# Patient Record
Sex: Male | Born: 1950 | Race: White | Hispanic: No | Marital: Married | Smoking: Former smoker
Health system: Southern US, Community
[De-identification: ages and names within clinical notes are randomized; demographics above are authoritative.]

## PROBLEM LIST (undated history)

## (undated) DIAGNOSIS — E785 Hyperlipidemia, unspecified: Secondary | ICD-10-CM

## (undated) DIAGNOSIS — K589 Irritable bowel syndrome without diarrhea: Secondary | ICD-10-CM

## (undated) DIAGNOSIS — K279 Peptic ulcer, site unspecified, unspecified as acute or chronic, without hemorrhage or perforation: Secondary | ICD-10-CM

## (undated) DIAGNOSIS — F17201 Nicotine dependence, unspecified, in remission: Secondary | ICD-10-CM

## (undated) DIAGNOSIS — I2510 Atherosclerotic heart disease of native coronary artery without angina pectoris: Secondary | ICD-10-CM

## (undated) DIAGNOSIS — I219 Acute myocardial infarction, unspecified: Secondary | ICD-10-CM

## (undated) DIAGNOSIS — I10 Essential (primary) hypertension: Secondary | ICD-10-CM

## (undated) DIAGNOSIS — Z87442 Personal history of urinary calculi: Secondary | ICD-10-CM

## (undated) DIAGNOSIS — M1990 Unspecified osteoarthritis, unspecified site: Secondary | ICD-10-CM

## (undated) HISTORY — PX: CARDIAC CATHETERIZATION: SHX172

## (undated) HISTORY — DX: Atherosclerotic heart disease of native coronary artery without angina pectoris: I25.10

## (undated) HISTORY — DX: Nicotine dependence, unspecified, in remission: F17.201

## (undated) HISTORY — DX: Unspecified osteoarthritis, unspecified site: M19.90

## (undated) HISTORY — DX: Irritable bowel syndrome, unspecified: K58.9

## (undated) HISTORY — DX: Peptic ulcer, site unspecified, unspecified as acute or chronic, without hemorrhage or perforation: K27.9

## (undated) HISTORY — DX: Hyperlipidemia, unspecified: E78.5

---

## 1988-01-12 HISTORY — PX: KIDNEY STONE SURGERY: SHX686

## 2003-01-06 ENCOUNTER — Emergency Department (HOSPITAL_COMMUNITY): Admission: EM | Admit: 2003-01-06 | Discharge: 2003-01-06 | Payer: Self-pay | Admitting: Emergency Medicine

## 2003-01-12 DIAGNOSIS — I2510 Atherosclerotic heart disease of native coronary artery without angina pectoris: Secondary | ICD-10-CM

## 2003-01-12 HISTORY — DX: Atherosclerotic heart disease of native coronary artery without angina pectoris: I25.10

## 2003-01-14 ENCOUNTER — Inpatient Hospital Stay (HOSPITAL_COMMUNITY): Admission: AD | Admit: 2003-01-14 | Discharge: 2003-01-17 | Payer: Self-pay | Admitting: Cardiology

## 2003-01-14 ENCOUNTER — Encounter: Payer: Self-pay | Admitting: Emergency Medicine

## 2003-01-31 ENCOUNTER — Encounter (HOSPITAL_COMMUNITY): Admission: RE | Admit: 2003-01-31 | Discharge: 2003-03-02 | Payer: Self-pay | Admitting: *Deleted

## 2003-03-04 ENCOUNTER — Encounter (HOSPITAL_COMMUNITY): Admission: RE | Admit: 2003-03-04 | Discharge: 2003-04-03 | Payer: Self-pay | Admitting: *Deleted

## 2003-06-12 ENCOUNTER — Ambulatory Visit (HOSPITAL_COMMUNITY): Admission: RE | Admit: 2003-06-12 | Discharge: 2003-06-12 | Payer: Self-pay | Admitting: *Deleted

## 2004-03-15 ENCOUNTER — Emergency Department (HOSPITAL_COMMUNITY): Admission: EM | Admit: 2004-03-15 | Discharge: 2004-03-15 | Payer: Self-pay | Admitting: Emergency Medicine

## 2004-03-17 ENCOUNTER — Encounter: Payer: Self-pay | Admitting: *Deleted

## 2004-03-17 ENCOUNTER — Inpatient Hospital Stay (HOSPITAL_COMMUNITY): Admission: EM | Admit: 2004-03-17 | Discharge: 2004-03-20 | Payer: Self-pay | Admitting: *Deleted

## 2004-04-09 ENCOUNTER — Ambulatory Visit (HOSPITAL_COMMUNITY): Admission: RE | Admit: 2004-04-09 | Discharge: 2004-04-09 | Payer: Self-pay | Admitting: Internal Medicine

## 2005-02-02 ENCOUNTER — Ambulatory Visit: Payer: Self-pay | Admitting: *Deleted

## 2006-01-11 HISTORY — PX: COLONOSCOPY: SHX174

## 2007-02-28 ENCOUNTER — Ambulatory Visit: Payer: Self-pay | Admitting: Cardiology

## 2008-03-26 ENCOUNTER — Ambulatory Visit: Payer: Self-pay | Admitting: Cardiology

## 2009-03-11 ENCOUNTER — Encounter (INDEPENDENT_AMBULATORY_CARE_PROVIDER_SITE_OTHER): Payer: Self-pay | Admitting: *Deleted

## 2009-05-06 DIAGNOSIS — M549 Dorsalgia, unspecified: Secondary | ICD-10-CM | POA: Insufficient documentation

## 2009-05-06 DIAGNOSIS — K589 Irritable bowel syndrome without diarrhea: Secondary | ICD-10-CM

## 2009-05-06 DIAGNOSIS — M542 Cervicalgia: Secondary | ICD-10-CM | POA: Insufficient documentation

## 2009-05-06 DIAGNOSIS — K279 Peptic ulcer, site unspecified, unspecified as acute or chronic, without hemorrhage or perforation: Secondary | ICD-10-CM

## 2009-05-12 ENCOUNTER — Ambulatory Visit: Payer: Self-pay | Admitting: Cardiology

## 2009-05-13 ENCOUNTER — Encounter: Payer: Self-pay | Admitting: Adult Health

## 2010-02-10 NOTE — Miscellaneous (Signed)
Summary: cbc,cmp,lipids  Clinical Lists Changes  Appended Document: cbc,cmp,lipids    Clinical Lists Changes  Observations: Added new observation of BACTERIA URN: rare (03/11/2009 13:49) Added new observation of RBCS MICRO U: 0-2 (03/11/2009 13:49) Added new observation of WBCS MICRO U: 0-2 (03/11/2009 13:49) Added new observation of CASTS URINE: none seen (03/11/2009 13:49) Added new observation of WBC UR: 0-2 (03/11/2009 13:49) Added new observation of NITRITE URN: neg (03/11/2009 13:49) Added new observation of PROTEIN UR: neg (03/11/2009 13:49) Added new observation of BLOOD UR: neg (03/11/2009 13:49) Added new observation of GLUCOSE UA: neg (03/11/2009 13:49) Added new observation of PH URINE: 5.5  (03/11/2009 13:49) Added new observation of SPEC GR URIN: 1.025  (03/11/2009 13:49) Added new observation of CALCIUM: 8.8 mg/dL (16/10/9602 54:09) Added new observation of ALBUMIN: 4.4 g/dL (81/19/1478 29:56) Added new observation of PROTEIN, TOT: 7.1 g/dL (21/30/8657 84:69) Added new observation of SGPT (ALT): 16 units/L (03/11/2009 13:49) Added new observation of SGOT (AST): 15 units/L (03/11/2009 13:49) Added new observation of ALK PHOS: 57 units/L (03/11/2009 13:49) Added new observation of BILI DIRECT: total bili   0.2 mg/dL (62/95/2841 32:44) Added new observation of CREATININE: 1.24 mg/dL (01/13/7251 66:44) Added new observation of BUN: 17 mg/dL (03/47/4259 56:38) Added new observation of BG RANDOM: 116 mg/dL (75/64/3329 51:88) Added new observation of CO2 PLSM/SER: 22 meq/L (03/11/2009 13:49) Added new observation of CL SERUM: 106 meq/L (03/11/2009 13:49) Added new observation of K SERUM: 4.7 meq/L (03/11/2009 13:49) Added new observation of NA: 141 meq/L (03/11/2009 13:49) Added new observation of LDL: 119 mg/dL (41/66/0630 16:01) Added new observation of HDL: 39 mg/dL (09/32/3557 32:20) Added new observation of TRIGLYC TOT: 198 mg/dL (25/42/7062 37:62) Added new  observation of CHOLESTEROL: 198 mg/dL (83/15/1761 60:73) Added new observation of PLATELETK/UL: 293 K/uL (03/11/2009 13:49) Added new observation of MCV: 89.1 fL (03/11/2009 13:49) Added new observation of HCT: 43.3 % (03/11/2009 13:49) Added new observation of HGB: 13.8 g/dL (71/06/2692 85:46) Added new observation of WBC COUNT: 7.1 10*3/microliter (03/11/2009 13:49)

## 2010-02-10 NOTE — Assessment & Plan Note (Signed)
Summary: 1 YR F/U PER REMINDER LIST/TG  Medications Added METOPROLOL SUCCINATE 50 MG XR24H-TAB (METOPROLOL SUCCINATE) Take 1/2 tablet by mouth once a day METOPROLOL SUCCINATE 50 MG XR24H-TAB (METOPROLOL SUCCINATE) Take 1/2 tablet by mouth once a day GEMFIBROZIL 600 MG TABS (GEMFIBROZIL) Take 1 tablet by mouth two times a day BAYER ASPIRIN 325 MG TABS (ASPIRIN) Take 1 tablet by mouth once a day      Allergies Added: NKDA  Visit Type:  Follow-up Primary Provider:  Sheppard Penton  CC:  follow-up visit.  History of Present Illness: Mr. Jeffren is a very pleasant  60 y/o CM who were are seeing on annual follow-up for known history of CAD.  He has a history of STEMI in Jan. 2005 with DES to the proximal LAD, secondary to 95% proximal obstruction, 70% ostial obstruction of the first and second diagonals.  No major obstruction in the CX or RCA.  EF at that time was 45%-50%.  Follow-up Echo was completed in June of the same year with EF calculated at 55%.  There was hypokinesis of a small segment of the mid and distal septum.  Since last visit in March of 2010, Mr. Trung has done well. He is complaining of extra stress in his life right now. He works for the [employer].  With the recent economic status and unemployment levels, he has been working longer hours and listening to a lot of people who are out of work.  He said that it really gets to him sometimes.  He therefore has a lot of shoulder and neck discomfort.  He thought that it may be the gemfibrozil causing this.  He cut back from 600mg  two times a day to daily with no change in symptoms.  He denies SOB, DOE, chest discomfort or fatigue. His main complaint is stress and muscle aches. He has stopped exercising regularly because of winter weather and has gotten out of the habit.  Preventive Screening-Counseling & Management  Alcohol-Tobacco     Smoking Status: quit     Year Quit: 2005  Current Medications (verified): 1)   Metoprolol Succinate 50 Mg Xr24h-Tab (Metoprolol Succinate) .... Take 1/2 Tablet By Mouth Once A Day 2)  Gemfibrozil 600 Mg Tabs (Gemfibrozil) .... Take 1 Tablet By Mouth Two Times A Day 3)  Bayer Aspirin 325 Mg Tabs (Aspirin) .... Take 1 Tablet By Mouth Once A Day  Allergies (verified): No Known Drug Allergies  Comments:  Nurse/Medical Assistant: The patient's medications and allergies were reviewed with the patient and were updated in the Medication and Allergy Lists. List reviewed.  Past History:  Past medical, surgical, family and social histories (including risk factors) reviewed, and no changes noted (except as noted below).  Past Medical History: Reviewed history from 05/06/2009 and no changes required. Current Problems:  BACK PAIN, CHRONIC (ICD-724.5) NECK PAIN, CHRONIC (ICD-723.1) CAD (ICD-414.00) IRRITABLE BOWEL SYNDROME (ICD-564.1) PUD, HX OF (ICD-V12.71) TOBACCO ABUSE, HX OF (ICD-V15.82) DYSLIPIDEMIA (ICD-272.4) AMI (ICD-410.90)  Past Surgical History: Reviewed history from 05/06/2009 and no changes required. cath 01/14/2003 90% Prox LAD c Cypherstent 10% Ef 45-50% kidney stone 1990  Family History: Reviewed history from 05/06/2009 and no changes required. Father:deceased age 76 due to kidney failure Mother:alive 28 tachycardia Siblings:brother alive and well age 42 1 sister alive and well age 17  Social History: Reviewed history from 05/06/2009 and no changes required. Full Time Married  Tobacco Use - Former.  Alcohol Use - no Regular Exercise - yes Drug Use -  no  Review of Systems       Generalized aches and pains of the neck and shoulder.  All other systems have been reviewed and are negative unless stated above.   Vital Signs:  Patient profile:   60 year old male Height:      72 inches Weight:      235 pounds BMI:     31.99 Pulse rate:   76 / minute BP sitting:   118 / 82  (left arm) Cuff size:   large  Vitals Entered By: Carlye Grippe  (May 12, 2009 3:36 PM)  Nutrition Counseling: Patient's BMI is greater than 25 and therefore counseled on weight management options. CC: follow-up visit   Physical Exam  General:  Well developed, well nourished, in no acute distress. Head:  normocephalic and atraumatic Eyes:  PERRLA/EOM intact; conjunctiva and lids normal. Ears:  TM's intact and clear with normal canals and hearing Nose:  no deformity, discharge, inflammation, or lesions Mouth:  Teeth, gums and palate normal. Oral mucosa normal. Neck:  Neck supple, no JVD. No masses, thyromegaly or abnormal cervical nodes. Lungs:  Clear bilaterally to auscultation and percussion. Heart:  Non-displaced PMI, chest non-tender; regular rate and rhythm, S1, S2 without murmurs, rubs or gallops. Carotid upstroke normal, no bruit. Normal abdominal aortic size, no bruits. Femorals normal pulses, no bruits. Pedals normal pulses. No edema, no varicosities. Abdomen:  Bowel sounds positive; abdomen soft and non-tender without masses, organomegaly, or hernias noted. No hepatosplenomegaly. Msk:  Back normal, normal gait. Muscle strength and tone normal. Extremities:  No clubbing or cyanosis. Neurologic:  Alert and oriented x 3. Skin:  Intact without lesions or rashes. Psych:  depressed affect.     EKG  Procedure date:  05/12/2009  Findings:      Normal sinus rhythm with rate of:  76 bpm  Impression & Recommendations:  Problem # 1:  CAD (ICD-414.00) Assessment Unchanged No symptoms. Seems to be doing well from a cardiac standpoint. Will see again in 1 year. No cardiac testing at this time as he is asymptomatic. i have advised him to resume his exercise regimine. His updated medication list for this problem includes:    Metoprolol Succinate 50 Mg Xr24h-tab (Metoprolol succinate) .Marland Kitchen... Take 1/2 tablet by mouth once a day    Bayer Aspirin 325 Mg Tabs (Aspirin) .Marland Kitchen... Take 1 tablet by mouth once a day  Orders: EKG w/ Interpretation  (93000)  Problem # 2:  NECK PAIN, CHRONIC (ICD-723.1) This appears to be stress related.  I have advised him to stop the gemfibrozil for one week.  If pain persists, he will follow-up with primary care physician for further evaluation.  I doubt the pain is related to gemfibrozil and stopping it for one week should not be harmful.   Patient Instructions: 1)  Your physician recommends that you schedule a follow-up appointment in: 1 year 2)  Your physician has recommended you make the following change in your medication: Stop taking Gemfibrozil for 1 week, if pain stops remain off medication if pain still present resume Gemfibrozil and follow up with Dr. Ouida Sills Prescriptions: METOPROLOL SUCCINATE 50 MG XR24H-TAB (METOPROLOL SUCCINATE) Take 1/2 tablet by mouth once a day  #15 x 12   Entered by:   Larita Fife Via LPN   Authorized by:   Joni Reining, NP   Signed by:   Larita Fife Via LPN on 16/10/9602   Method used:   Electronically to        [facility]* (retail)       [address]       [place], [place]  [address]       Ph: [phone] or [phone]       Fax: [phone]   RxID:   [id number]

## 2010-05-26 NOTE — Letter (Signed)
February 28, 2007    Kingsley Callander. Ouida Sills, MD  [address]  [place], [place] [address]   RE:  Johnny Johns, Johnny Johns  MRN:  [id number]  /  DOB:  1950-03-27   Dear Channing Mutters:   Johnny Johns is seen in the office today after being lost to follow up for  the past 2 years.  He call for a prescription renewal and was advised  that it was time to be seen.  As you know, he has done very well since  suffering an acute anterior myocardial infarction in January of 2005,  treated with stenting of a 90% proximal LAD lesion.  LV systolic  function was mildly impaired at that time.  He was treated with Plavix  for 6 months thereafter, and has been taking only a low dose of atenolol  and aspirin since then.  He has been tried on multiple statins, all with  muscle symptoms.  He also reports an adverse reaction to a different  cholesterol medicine, but does not know which one.   Otherwise, he has done very well.  His only problem was with GERD  symptoms that are well controlled with Nexium as needed.  He has not  been to the emergency department nor required hospitalization.  Vaccinations are up-to-date.   PHYSICAL EXAMINATION:  GENERAL:  A pleasant gentleman in no acute  distress.  VITAL SIGNS:  The weight is 220, 7 pounds less than in January of 2007.  Blood pressure 115/80, heart rate 65 and regular, respirations 18.  NECK:  No jugular venous distention; normal carotid upstrokes without  bruits.  ENDOCRINE:  No thyromegaly.  LUNGS:  Clear.  CARDIAC:  Normal first and second heart sounds.  Normal PMI.  ABDOMEN:  Soft and nontender; no bruits; no organomegaly.  EXTREMITIES:  Distal pulses intact; no edema.   LABORATORY DATA:  Laboratory from your office shows a normal CBC and  normal chemistry profile.  Lipid profile is suboptimal with a total  cholesterol of 188, triglycerides of 289, HDL of 31 and LDL of 99.   IMPRESSION:  Johnny Johns is doing quite well overall.  Since he cannot  tolerate a statin, a fibrate  would be the next appropriate choice.  We  will start fenofibrate at a dose of 145 mg daily and check a lipid  profile in 1 month.  If results are acceptable, I will plan to see this  nice gentleman again in 1 year.    Sincerely,      Gerrit Friends. Dietrich Pates, MD, [facility]  Electronically Signed    RMR/MedQ  DD: 02/28/2007  DT: 03/01/2007  Job #: [id number]

## 2010-05-26 NOTE — Assessment & Plan Note (Signed)
[facility]                       [place] CARDIOLOGY OFFICE NOTE   NAME:Johnny Johns, Johnny Johns                       MRN:          [id number]  DATE:03/26/2008                            DOB:          26-Dec-1950    CARDIOLOGIST:  Gerrit Friends. Dietrich Pates, MD, [facility]   PRIMARY CARE PHYSICIAN:  Kingsley Callander. Ouida Sills, MD   REASON FOR VISIT:  One-year followup.   HISTORY OF PRESENT ILLNESS:  Johnny Johns is a 60 year old male with a  history of coronary artery disease status post acute anterior STEMI in  January 2005 treated with Cypher drug-eluting stent to the proximal LAD  with an EF of 45-50%.  I do not see that he has had an assessment of his  LV function since the time of his heart attack.  He did have residual  70% ostial stenosis in the first and second diagonal branches of the  LAD.  He had no significant obstruction in the circumflex or RCA.  He  was last seen by Dr. Dietrich Pates in February 2009.  At that time, he was  doing well.  The patient had had significant problems with tolerating  all statins.  He was placed on gemfibrozil and fish oil at that time.  He tells me he recently had lipids performed by Dr. Ouida Sills.  He denies  any recent symptoms of chest discomfort or significant shortness of  breath.  Denies any orthopnea, PND, or pedal edema.  Denies any  palpitations or syncope.   CURRENT MEDICATIONS:  1. Aspirin 325 mg daily.  2. Gemfibrozil 600 mg b.i.d.  3. Fish oil 2 tablets daily.  4. Xanax p.r.n.  5. Nitroglycerin p.r.n.   PHYSICAL EXAMINATION:  GENERAL:  He is a well-nourished, well-developed  male in no acute distress.  VITAL SIGNS:  Blood pressure is 120/82, pulse 96, weight 232 pounds.  HEENT:  Normal.  NECK:  Without JVD.  CARDIAC:  Normal S1 and S2.  Regular rate and rhythm.  LUNGS:  Clear to auscultation bilaterally.  ABDOMEN:  Soft, nontender.  EXTREMITIES:  Without edema.  NEUROLOGIC:  He is alert and oriented x3.  Cranial nerves II-XII  are  grossly intact.  VASCULAR:  Without carotid bruits bilaterally.   Electrocardiogram reveals sinus rhythm with a heart rate of 96.  Normal  axis.  Inferior Q-waves, poor R-wave progression.  No ischemic changes.   ASSESSMENT AND PLAN:  1. Coronary artery disease status post anterior ST-elevation      myocardial infarction January 2005, treated with Cypher stent to      the left anterior descending.  He had mild LV dysfunction with an      EF of 45-50% at that time.  He is having no symptoms of angina at      this time.  He is having no symptoms of heart failure.  He      continues on an aspirin a day and he is doing well.  He does have a      high basal heart rate in the 90s.  He had his atenolol discontinued      when  he was last seen.  I think he would benefit from being on a      beta-blocker especially in light of the fact he has had prior      myocardial infarction.  I would rather see his heart rate down in      the 70s to 60s.  Therefore, I have placed him back on metoprolol ER      25 mg daily.  I discussed this with him and he agrees to start the      medication.  2. Dyslipidemia.  He is followed chronically by Dr. Ouida Sills.  He      apparently had a recent lipid panel.  We will obtain those values      for our records.  As noted previously, he is intolerant to all      STATINS.   DISPOSITION:  The patient will follow up with me or Dr. Dietrich Pates in 1  year or sooner p.r.n.      Tereso Newcomer, PA-C  Electronically Signed      Gerrit Friends. Dietrich Pates, MD, [facility]  Electronically Signed   SW/MedQ  DD: 03/26/2008  DT: 03/27/2008  Job #: [id number]   cc:   Kingsley Callander. Ouida Sills, MD

## 2010-05-29 NOTE — Discharge Summary (Signed)
NAME:  Johnny Johns, Johnny Johns                          ACCOUNT NO.:  [id number]   MEDICAL RECORD NO.:  [id number]                   PATIENT TYPE:  INP   LOCATION:  3737                                 FACILITY:  [facility]   PHYSICIAN:  Knobel Bing, M.D.               DATE OF BIRTH:  [birth year]   DATE OF ADMISSION:  01/14/2003  DATE OF DISCHARGE:  01/17/2003                                 DISCHARGE SUMMARY   PROCEDURE:  1. Cardiac catheterization.  2. Coronary arteriogram.  3. Left ventriculogram.  4. PTCA and Cypher stent to one vessel.   HOSPITAL COURSE:  Johnny Johns is a 60 year old male with no known history of  coronary artery disease.  He had onset of substernal chest pain at 6:30 a.m.  and went to [facility] ER where his EKG showed anterior ST elevation.  He  was treated there with aspirin, heparin, and Integrilin and transported  urgently to [facility] for emergent catheterization.   The cardiac catheterization showed a 95% mid LAD that was treated with PTCA  and a Cypher stent reducing the stenosis to less than 10%.  He had distal  embolus seen on the catheterization films and 70% stenosis in each of the  first two diagonals, but no other critical disease.  His EF was 45-50% with  apical hypokinesis.  He tolerated the procedure well and the sheath was  removed without difficulty.   His enzymes were positive for an MI but his CK and MBs trended down and  normalized.  He had no further episodes of chest pain.  He had a Statin  added to his medication regimen and had a lipid profile checked as part of  his evaluation.  The lipid profile showed a total cholesterol of 165,  triglycerides 229, HDL 28, LDL 91.  He is to go home on Zocor 40 mg daily  and have follow-up LFTs and a lipid profile in six to 12 weeks.   Johnny Johns had some sutures placed on January 06, 2003 after an accident  with a carving knife.  The sutures were 10 days out and it was felt it was  time to remove  them.  The suture site was without erythema or edema and no  signs of infection.  The sutures were removed and Steri-Strips were applied.   Johnny Johns was seen by cardiac rehabilitation and was ambulating without  chest pain or shortness of breath.  He was referred for outpatient cardiac  rehabilitation.  Additionally, he had a history of tobacco use prior to  coming into the hospital, but stated he would quit smoking and seemed very  motivated.   By January 17, 2003 Dr. Juanda Chance felt that he was stable for discharge and  could follow up in the [place] office.   LABORATORIES:  CK-MB peaked 2250/253.7.  Sodium 141, potassium 4.0, chloride  110, CO2 25, BUN 11, creatinine  1.0, glucose 100.  Hemoglobin 13.0,  hematocrit 38.5, WBC 12.1, platelets 230,000.   Chest x-ray:  No acute cardiopulmonary findings.   CONDITION ON DISCHARGE:  Improved.   DISCHARGE DIAGNOSES:  1. Acute anterior wall myocardial infarction.  2. Dyslipidemia.  3. History of tobacco use.  4. History of peptic ulcer disease secondary to Aleve and lower     gastrointestinal bleed.  5. Irritable bowel syndrome.  6. History of chronic back and neck pain.  7. History of intolerance to Vioxx with joint swelling.   DISCHARGE INSTRUCTIONS:  His activity level is to include no heavy lifting  or strenuous activity until cleared by M.D. and no driving for five days.  He can most likely return to work on February 12, 2003.  He is to stick to a  low fat and salt diet and call the office for any problems with  catheterization site.  He is not to use tobacco.  he has a follow-up  appointment with Dr. Dorethea Clan in [place] on January 19 at 1:00.  He is to  follow up with Dr. Ouida Sills as scheduled.   DISCHARGE MEDICATIONS:  1. Aspirin 325 mg daily.  2. Metoprolol 50 mg one-half tablet b.i.d.  3. Plavix 75 mg daily.  4. Xanax 0.25 mg p.r.n.  5. Zocor 40 mg daily.  6. Nitroglycerin p.r.n.      Theodore Demark, P.A. [facility]                   Randlett Bing, M.D.    RB/MEDQ  D:  01/17/2003  T:  01/18/2003  Job:  [id number]   cc:   Kingsley Callander. Ouida Sills, M.D.  [address]  [place]  [place] [address]  Fax: [phone]   Vida Roller, M.D.  Fax: [phone]

## 2010-05-29 NOTE — Procedures (Signed)
NAME:  MYKAEL, BATZ                          ACCOUNT NO.:  [id number]   MEDICAL RECORD NO.:  [id number]                   PATIENT TYPE:  OUT   LOCATION:  RAD                                  FACILITY:  [facility]   PHYSICIAN:  [place] Bing, M.D.               DATE OF BIRTH:  [birth year]   DATE OF PROCEDURE:  06/12/2003  DATE OF DISCHARGE:                                  ECHOCARDIOGRAM   REFERRING PHYSICIAN:  Kingsley Callander. Ouida Sills, M.D. and Vida Roller, M.D.   CLINICAL DATA:  A 60 year old gentleman with coronary disease including  prior myocardial infarction.   M-MODE MEASUREMENTS:  Aorta 2.9, left atrium 4.6, septum 1.2, posterior wall  1.3, LV diastole 4.8, LV systole 2.8.   1. Technically adequate echocardiographic study.  2. Normal left atrium, right atrium, and right ventricle.  3. Normal aortic valve; mild annular calcification.  4. Normal mitral valve; mild annular calcification.  5. Normal tricuspid and pulmonic valves; trivial regurgitation.  6. Normal internal dimension of the left ventricle; mild concentric LVH.     Hypokinesis of a small segment of the mid and distal septum; overall LV     systolic function is normal with an estimated ejection fraction of 0.55.  7. Normal IVC.      ___________________________________________                                             Bing, M.D.   RR/MEDQ  D:  06/12/2003  T:  06/13/2003  Job:  [id number]   cc:   Kingsley Callander. Ouida Sills, M.D.  [address]  [place]  [place] [address]  Fax: [phone]   Vida Roller, M.D.  Fax: [phone]
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
## 2010-05-29 NOTE — Cardiovascular Report (Signed)
NAME:  Johnny Johns, Johnny Johns                          ACCOUNT NO.:  [id number]   MEDICAL RECORD NO.:  [id number]                   PATIENT TYPE:  INP   LOCATION:  2860                                 FACILITY:  [facility]   PHYSICIAN:  Charlies Constable, M.D.                  DATE OF BIRTH:  1950/12/04   DATE OF PROCEDURE:  01/14/2003  DATE OF DISCHARGE:                              CARDIAC CATHETERIZATION   CLINICAL HISTORY:  Johnny Johns is 60 years old and is Interior and spatial designer of [employer].  He has no prior history of heart disease, but  is a smoker.  He developed the onset of chest pain at 6:30 this morning and  presented to [facility] Emergency Room where his EKG showed an acute  anterior wall infarction.  He was given aspirin, heparin and integrilin and  transferred to [place] for intervention by [facility].   PROCEDURE:  The procedure was performed via the right femoral artery using  arterial sheath and 6-French preformed coronary catheters.  A front wall  arterial puncture was performed and Omnipaque contrast was used.  After  completion of the diagnostic study we made decision to proceed with  intervention.   The patient's ACT was greater than 200 seconds from his previous heparin  dosage.  He was given 300 mg of Plavix and was on an integrilin drip which  had been started at [facility].  We used a Q-4 7-French guiding catheter  with side holes and a PT-2 light support wire.  We crossed the subtotally  occluded lesion in the proximal LAD with the wire without difficulty.  There  appeared to be a thrombus burden at the lesion and so we elected to go in  first with the export catheter.  We made two passes and removed some clot  and some atheromatous material with improvement in the appearance of the  vessel.  There was a distal embolization which we detected later in the  procedure and this may have occurred at the time of export or at the time of  subsequent balloon inflation.   After completion of the export run, we  dilated with a 2.75 x 20-mm Quantum Maverick performing one inflation up to  12 atmospheres for 30 seconds. We then deployed a 3.0 x 23-mm Cypher stent  with one inflation of 14 atmospheres for 30 seconds.  There were two  diagonal branches just before the lesion and there was some segmental  disease extending just past the second diagonal branch.  We attempted to  place the proximal edge of the stent so it would not cross the diagonal  branch although we realized there was some segmental disease in this area.  We then post dilated the stent with a 3.25 x 20-mm Quantum Maverick avoiding  both distal edges.  We inflated this to 15 atmospheres for 30 seconds.  Repeat diagnostic studies  were then performed through the guiding catheter.  The patient tolerated the procedure well and left the laboratory in  satisfactory condition.   RESULTS:  Left main coronary artery:  The left main coronary was free of  significant disease.   Left anterior descending artery:  Left anterior descending artery gave rise  to two diagonal branches, septal perforator, two more septal perforators and  a third diagonal branch.  There was a long area of 95% stenosis after the  second diagonal branch which appeared to have superimposed thrombus.  The  initial flow was TIMI-3 flow.  There were 70% ostial stenosis at both the  first and second diagonal branches.   Circumflex artery:  The circumflex artery gave rise to an intermediate  branch, marginal branch, posterior lateral branch. These vessels were free  of significant disease.  This was a small vessel.   Right coronary artery:  The right coronary artery is a large dominant vessel  that supplied a conus branch, two right ventricular branches, a posterior  descending branch and three large posterior lateral branches.  These vessels  were free of significant disease.   LEFT VENTRICULOGRAM:  The left ventriculogram performed  in the RAO  projection showed hypokinesis of the apex.  The overall wall motion was  fairly well preserved and the estimated ejection fraction was 45-50%.   The aortic pressure was 89/63 with a mean of 75.  Left ventricular pressure  was 89/13.   Following thrombectomy and stenting of the lesion in the proximal LAD, the  stenosis improved from 95% to less than 10% and the flow remained at TIMI-3  flow.  There was a small distal embolus at the very tip of the apex with  abrupt cut off of the vessel.   CONCLUSIONS:  1. Acute anterior wall myocardial infarction with 95% stenosis in the     proximal LAD, 70% ostial stenoses in the first and second diagonal     branches of the LAD.  No major obstruction in the circumflex and right     coronary arteries and apical wall hypokinesis with an estimated ejection     fraction of 45-50%.  2. Successful stenting of the lesion in the proximal LAD with improvement of     stent renarrowing from 95% to less than 10% with TIMI-3 flow before and     after intervention.   DISPOSITION:  The patient returned to the postangioplasty unit for further  observation. I think his outlook for recovery is good since the artery was  open and there was wall motion at the time of initial study.                                               Charlies Constable, M.D.    BB/MEDQ  D:  01/14/2003  T:  01/14/2003  Job:  [id number]   cc:   Kingsley Callander. Ouida Sills, M.D.  [address]  [place]  [place] [address]  Fax: [phone]   Vida Roller, M.D.  Fax: [phone]

## 2010-05-29 NOTE — Discharge Summary (Signed)
NAME:  ESKER, DEVER                ACCOUNT NO.:  [id number]   MEDICAL RECORD NO.:  [id number]          PATIENT TYPE:  INP   LOCATION:  A314                          FACILITY:  [facility]   PHYSICIAN:  Kingsley Callander. Ouida Sills, MD       DATE OF BIRTH:  1950/02/04   DATE OF ADMISSION:  03/17/2004  DATE OF DISCHARGE:  03/10/2006LH                                 DISCHARGE SUMMARY   DISCHARGE DIAGNOSES:  1.  Right lower lobe pneumonia.  2.  Coronary artery disease.  3.  History of peptic ulcer disease.  4.  Irritable bowel syndrome.  5.  Hyperlipidemia.  6.  History of nephrolithiasis.   HOSPITAL COURSE:  This patient is a 60 year old male who presented with  cough and sore throat. Temperature had been in the 101 range. He had been  emergency room 2 days prior to admission and had been treated with Zithromax  for pneumonia. On presentation, his pO2 was 41. Due to our CT scanner being  down, he was sent to [facility] for a CT scan of the chest. His CT revealed  bilateral lower lobe pneumonia but no pulmonary embolus. His oxygen  saturation was 100% on 2 liters. He was hospitalized and treated with  Rocephin and Zithromax IV. Due to monocytosis on a CBC, a monospot was  obtained which was negative. A sputum culture still remains pending. He had  a sore throat which was red on exam. He had no exudate. Strep screen was  negative. His white count was initially 9,000. A repeat was 5,400, each with  15% monocytes.   His fever resolved. His cough improved. His pharyngitis improved. He was  improved and stable for discharge on the 10th. He will be seen in follow-up  in the office in 1-2 weeks and will have a repeat chest x-ray prior to that  visit. A repeat ABG on room air revealed pO2 of 63.   DISCHARGE MEDICATIONS:  1.  Avelox 400 mg q.d. for 7 days.  2.  Atenolol 12.5 mg q.d.  3.  Aspirin 325 mg q.d.  4.  Vytorin 10/40 q.d.      ROF/MEDQ  D:  03/20/2004  T:  03/20/2004  Job:  [id number]

## 2010-07-21 ENCOUNTER — Encounter: Payer: Self-pay | Admitting: Adult Health

## 2010-07-27 ENCOUNTER — Encounter: Payer: Self-pay | Admitting: *Deleted

## 2010-07-27 ENCOUNTER — Encounter: Payer: Self-pay | Admitting: Cardiology

## 2010-07-27 ENCOUNTER — Ambulatory Visit (INDEPENDENT_AMBULATORY_CARE_PROVIDER_SITE_OTHER): Payer: BC Managed Care – PPO | Admitting: Cardiology

## 2010-07-27 VITALS — BP 131/84 | HR 85 | Ht 72.0 in | Wt 215.0 lb

## 2010-07-27 DIAGNOSIS — I2510 Atherosclerotic heart disease of native coronary artery without angina pectoris: Secondary | ICD-10-CM

## 2010-07-27 DIAGNOSIS — E785 Hyperlipidemia, unspecified: Secondary | ICD-10-CM

## 2010-07-27 DIAGNOSIS — E782 Mixed hyperlipidemia: Secondary | ICD-10-CM | POA: Insufficient documentation

## 2010-07-27 DIAGNOSIS — Z87891 Personal history of nicotine dependence: Secondary | ICD-10-CM

## 2010-07-27 DIAGNOSIS — M1990 Unspecified osteoarthritis, unspecified site: Secondary | ICD-10-CM

## 2010-07-27 DIAGNOSIS — F17201 Nicotine dependence, unspecified, in remission: Secondary | ICD-10-CM

## 2010-07-27 MED ORDER — PRAVASTATIN SODIUM 10 MG PO TABS: 10.0000 mg | ORAL_TABLET | Freq: Every evening | ORAL | Status: DC

## 2010-07-27 NOTE — Assessment & Plan Note (Signed)
There been no symptoms to suggest myocardial ischemia since patient's acute MI nearly a decade ago.  We will continue to optimally manage cardiovascular risk factors.

## 2010-07-27 NOTE — Assessment & Plan Note (Signed)
Mr. Shidler is congratulated on his weight loss and continued abstinence from use of tobacco products.

## 2010-07-27 NOTE — Patient Instructions (Addendum)
Your physician recommends that you schedule a follow-up appointment in:1 year Your physician has recommended you make the following change in your medication: hold niaspan for now, begin pravastatin 10mg  daily if symptoms develop decrease to 5mg  daily Your physician recommends that you return for lab work in: 1 month

## 2010-07-27 NOTE — Progress Notes (Signed)
HPI : Mr. Lapaglia returns to the office as scheduled for continuing assessment and treatment of coronary disease and cardiovascular risk factors.  Since last visit, he has been asymptomatic from a cardiac standpoint.  Specifically, he denies orthopnea, PND, chest discomfort, dyspnea, lightheadedness or syncope.  He has had no cause to visit the emergency department or hospital.  Dr. Ouida Sills continues to manage his general medical care and to attempt to treat hyperlipidemia.  Patient Has been unable to tolerate simvastatin, Vytorin, atorvastatin and fenofibrate.  Current Outpatient Prescriptions on File Prior to Visit  Medication Sig Dispense Refill  . aspirin 325 MG tablet Take 325 mg by mouth daily.           Allergies  Allergen Reactions  . Gemfibrozil   . Metoprolol Other (See Comments)    Myalgias and neck pain  . Simvastatin Other (See Comments)    Myalgias; also Vytorin and Lipitor  . Vioxx (Rofecoxib)       Past medical history, social history, and family history reviewed and updated.  ROS: See history of present illness.  PHYSICAL EXAM: BP 131/84  Pulse 85  Ht 6' (1.829 m)  Wt 97.523 kg (215 lb)  BMI 29.16 kg/m2  SpO2 97% ; weight decreased 20 pounds since last visit General-Well developed; no acute distress Body habitus-overweight Neck-No JVD; no carotid bruits Lungs-clear lung fields; resonant to percussion Cardiovascular-normal PMI; normal S1 and S2; soft S4 Abdomen-normal bowel sounds; soft and non-tender without masses or organomegaly Musculoskeletal-No deformities, no cyanosis or clubbing Neurologic-Normal cranial nerves; symmetric strength and tone Skin-Warm, no significant lesions Extremities-distal pulses intact; no edema  ASSESSMENT AND PLAN:

## 2010-07-27 NOTE — Assessment & Plan Note (Addendum)
Lipid profile is suboptimal, but lipids have been difficult to treat due to multiple drug intolerances.  We will try pravastatin at low dose, initially 10 mg per day.  Patient will cut this to 5 mg per day if he experiences symptoms and will call if he finds that he needs to discontinue this medication entirely.  A repeat lipid profile will be obtained in 1 month.  I will see him back in the office in one year.

## 2010-08-02 ENCOUNTER — Encounter: Payer: Self-pay | Admitting: Cardiology

## 2010-08-27 ENCOUNTER — Other Ambulatory Visit: Payer: Self-pay | Admitting: Cardiology

## 2010-08-28 LAB — LIPID PANEL
Cholesterol: 179 mg/dL (ref 0–200)
VLDL: 55 mg/dL — ABNORMAL HIGH (ref 0–40)

## 2010-09-11 ENCOUNTER — Encounter: Payer: Self-pay | Admitting: Cardiology

## 2010-12-23 ENCOUNTER — Encounter: Payer: Self-pay | Admitting: Cardiology

## 2011-09-07 ENCOUNTER — Ambulatory Visit (INDEPENDENT_AMBULATORY_CARE_PROVIDER_SITE_OTHER): Payer: BC Managed Care – PPO | Admitting: Cardiology

## 2011-09-07 ENCOUNTER — Encounter: Payer: Self-pay | Admitting: Cardiology

## 2011-09-07 VITALS — BP 116/82 | HR 71 | Ht 72.0 in | Wt 215.0 lb

## 2011-09-07 DIAGNOSIS — E785 Hyperlipidemia, unspecified: Secondary | ICD-10-CM

## 2011-09-07 DIAGNOSIS — I7090 Unspecified atherosclerosis: Secondary | ICD-10-CM

## 2011-09-07 DIAGNOSIS — K279 Peptic ulcer, site unspecified, unspecified as acute or chronic, without hemorrhage or perforation: Secondary | ICD-10-CM

## 2011-09-07 DIAGNOSIS — K589 Irritable bowel syndrome without diarrhea: Secondary | ICD-10-CM

## 2011-09-07 DIAGNOSIS — I2510 Atherosclerotic heart disease of native coronary artery without angina pectoris: Secondary | ICD-10-CM

## 2011-09-07 NOTE — Patient Instructions (Signed)
Your physician recommends that you schedule a follow-up appointment in: 1 year  Your physician has recommended you make the following change in your medication: Resume Aspirin 81 mg daily

## 2011-09-07 NOTE — Progress Notes (Deleted)
Name: Johnny Johns    DOB: 11-15-1950  Age: 61 y.o.  MR#: [id number]       PCP:  Carylon Perches, MD      Insurance: @PAYORNAME @   CC:    Chief Complaint  Patient presents with  . Hyperlipidemia    No complains / Stopped lipitor due to muscle aches/TC    VS BP 116/82  Pulse 71  Ht 6' (1.829 m)  Wt 215 lb (97.523 kg)  BMI 29.16 kg/m2  Weights Current Weight  09/07/11 215 lb (97.523 kg)  07/27/10 215 lb (97.523 kg)  05/12/09 235 lb (106.595 kg)    Blood Pressure  BP Readings from Last 3 Encounters:  09/07/11 116/82  07/27/10 131/84  05/12/09 118/82     Admit date:  (Not on file) Last encounter with RMR:  Visit date not found   Allergy Allergies  Allergen Reactions  . Gemfibrozil   . Metoprolol Other (See Comments)    Myalgias and neck pain  . Simvastatin Other (See Comments)    Myalgias; also Vytorin and Lipitor  . Statins Other (See Comments)    Muscle aches  . Vioxx (Rofecoxib)     No current outpatient prescriptions on file.    Discontinued Meds:    Medications Discontinued During This Encounter  Medication Reason  . aspirin 81 MG tablet Discontinued by provider  . pravastatin (PRAVACHOL) 10 MG tablet Discontinued by provider    Patient Active Problem List  Diagnosis  . IRRITABLE BOWEL SYNDROME  . Peptic ulcer disease  . Arteriosclerotic cardiovascular disease (ASCVD)  . Tobacco abuse, in remission  . Hyperlipidemia  . Degenerative joint disease    LABS No visits with results within 3 Month(s) from this visit. Latest known visit with results is:  Orders Only on 08/27/2010  Component Date Value  . Cholesterol 08/27/2010 179   . Triglycerides 08/27/2010 274*  . HDL 08/27/2010 43   . Total CHOL/HDL Ratio 08/27/2010 4.2   . VLDL 08/27/2010 55*  . LDL Cholesterol 08/27/2010 81      Results for this Opt Visit:     Results for orders placed in visit on 08/27/10  LIPID PANEL      Component Value Range   Cholesterol 179  0 - 200 mg/dL   Triglycerides 409 (*) <150 mg/dL   HDL 43  >81 mg/dL   Total CHOL/HDL Ratio 4.2     VLDL 55 (*) 0 - 40 mg/dL   LDL Cholesterol 81  0 - 99 mg/dL    EKG Orders placed in visit on 05/13/09  . CONVERTED CEMR EKG     Prior Assessment and Plan Problem List as of 09/07/2011            Cardiology Problems   Arteriosclerotic cardiovascular disease (ASCVD)   Last Assessment & Plan Note   07/27/2010 Office Visit Signed 07/27/2010  8:52 PM by Kathlen Brunswick, MD    There been no symptoms to suggest myocardial ischemia since patient's acute MI nearly a decade ago.  We will continue to optimally manage cardiovascular risk factors.    Hyperlipidemia   Last Assessment & Plan Note   07/27/2010 Office Visit Addendum 07/27/2010  8:53 PM by Kathlen Brunswick, MD    Lipid profile is suboptimal, but lipids have been difficult to treat due to multiple drug intolerances.  We will try pravastatin at low dose, initially 10 mg per day.  Patient will cut this to 5 mg per day if he experiences  symptoms and will call if he finds that he needs to discontinue this medication entirely.  A repeat lipid profile will be obtained in 1 month.  I will see him back in the office in one year.      Other   IRRITABLE BOWEL SYNDROME   Peptic ulcer disease   Tobacco abuse, in remission   Last Assessment & Plan Note   07/27/2010 Office Visit Signed 07/27/2010  8:51 PM by Kathlen Brunswick, MD    Mr. Benassi is congratulated on his weight loss and continued abstinence from use of tobacco products.    Degenerative joint disease       Imaging: No results found.   FRS Calculation: Score not calculated. Missing: Total Cholesterol

## 2011-09-07 NOTE — Assessment & Plan Note (Signed)
Mild dyslipidemia, primarily with elevated triglycerides and low normal HDL.  Definite intolerance to statin therapy.  While fenofibrate or gemfibrozil might provide modest benefit, I am not inclined to treat him pharmacologically.

## 2011-09-07 NOTE — Progress Notes (Signed)
Patient ID: Johnny Johns, male   DOB: [birth year], 61 y.o.   MRN: [id number]  HPI: Scheduled return visit for this very nice gentleman with coronary artery disease.  He suffered a myocardial infarction 8 years ago, but has done extremely well since with no cardiac symptoms, few cardiovascular risk factors and no other known vascular disease.  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  He is expecting to retire in the near future and anticipates increased activity thereafter.  A trial of low-dose pravastatin was unsuccessful last year.  Patient developed lower extremity myalgias with 10 mg a day over the first month.  These persisted even when the dosage was decreased to 5 mg per day.  He misunderstood his medication instructions at his last visit and has also stopped taking daily aspirin.  Prior to Admission medications   Not on File   Allergies  Allergen Reactions  . Gemfibrozil   . Metoprolol Other (See Comments)    Myalgias and neck pain  . Simvastatin Other (See Comments)    Myalgias; also Vytorin and Lipitor  . Statins Other (See Comments)    Muscle aches  . Vioxx (Rofecoxib)      Past medical history, social history, and family history reviewed and updated.  ROS: Denies orthopnea, PND, pedal edema, palpitations, lightheadedness or syncope.  All other systems reviewed and are negative.  PHYSICAL EXAM: BP 116/82  Pulse 71  Ht 6' (1.829 m)  Wt 97.523 kg (215 lb)  BMI 29.16 kg/m2  General-Well developed; no acute distress Body habitus-Mildly overweight Neck-No JVD; no carotid bruits Lungs-clear lung fields; resonant to percussion Cardiovascular-normal PMI; normal S1 and S2 Abdomen-normal bowel sounds; soft and non-tender without masses or organomegaly Musculoskeletal-No deformities, no cyanosis or clubbing Neurologic-Normal cranial nerves; symmetric strength and tone Skin-Warm, no significant  lesions Extremities-distal pulses intact; no edema  ASSESSMENT AND PLAN:  Clifton Forge Bing, MD 09/07/2011 12:05 PM

## 2011-09-07 NOTE — Assessment & Plan Note (Signed)
Patient continues to do well, now 8 years following acute myocardial infarction treated with urgent stenting of a critical proximal LAD lesion. He's encouraged to increase daily activity and to call should any symptoms possibly reflecting myocardial ischemia occur.

## 2012-08-16 ENCOUNTER — Ambulatory Visit (INDEPENDENT_AMBULATORY_CARE_PROVIDER_SITE_OTHER): Payer: BC Managed Care – PPO | Admitting: Cardiovascular Disease

## 2012-08-16 ENCOUNTER — Encounter: Payer: Self-pay | Admitting: Cardiovascular Disease

## 2012-08-16 VITALS — BP 137/91 | HR 75 | Ht 71.0 in | Wt 234.0 lb

## 2012-08-16 DIAGNOSIS — I10 Essential (primary) hypertension: Secondary | ICD-10-CM

## 2012-08-16 DIAGNOSIS — I7090 Unspecified atherosclerosis: Secondary | ICD-10-CM

## 2012-08-16 DIAGNOSIS — E785 Hyperlipidemia, unspecified: Secondary | ICD-10-CM

## 2012-08-16 DIAGNOSIS — I2510 Atherosclerotic heart disease of native coronary artery without angina pectoris: Secondary | ICD-10-CM
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
## 2012-08-16 NOTE — Patient Instructions (Addendum)

## 2014-11-06 DIAGNOSIS — Z23 Encounter for immunization: Secondary | ICD-10-CM | POA: Diagnosis not present

## 2015-12-03 ENCOUNTER — Encounter (INDEPENDENT_AMBULATORY_CARE_PROVIDER_SITE_OTHER): Payer: Self-pay | Admitting: *Deleted

## 2016-12-15 ENCOUNTER — Encounter (INDEPENDENT_AMBULATORY_CARE_PROVIDER_SITE_OTHER): Payer: Self-pay | Admitting: *Deleted

## 2018-07-20 ENCOUNTER — Encounter (INDEPENDENT_AMBULATORY_CARE_PROVIDER_SITE_OTHER): Payer: Self-pay | Admitting: *Deleted

## 2018-09-19 ENCOUNTER — Other Ambulatory Visit: Payer: Self-pay

## 2018-09-19 DIAGNOSIS — Z20822 Contact with and (suspected) exposure to covid-19: Secondary | ICD-10-CM

## 2018-09-20 LAB — NOVEL CORONAVIRUS, NAA: SARS-CoV-2, NAA: NOT DETECTED

## 2018-10-24 ENCOUNTER — Other Ambulatory Visit: Payer: Self-pay

## 2018-10-24 DIAGNOSIS — Z20822 Contact with and (suspected) exposure to covid-19: Secondary | ICD-10-CM

## 2018-10-26 LAB — NOVEL CORONAVIRUS, NAA: SARS-CoV-2, NAA: NOT DETECTED

## 2018-12-01 ENCOUNTER — Other Ambulatory Visit: Payer: Self-pay

## 2018-12-01 DIAGNOSIS — Z20822 Contact with and (suspected) exposure to covid-19: Secondary | ICD-10-CM

## 2018-12-04 LAB — NOVEL CORONAVIRUS, NAA: SARS-CoV-2, NAA: NOT DETECTED

## 2019-02-08 DIAGNOSIS — H02834 Dermatochalasis of left upper eyelid: Secondary | ICD-10-CM | POA: Diagnosis not present

## 2019-02-08 DIAGNOSIS — Z961 Presence of intraocular lens: Secondary | ICD-10-CM | POA: Diagnosis not present

## 2019-02-08 DIAGNOSIS — H02831 Dermatochalasis of right upper eyelid: Secondary | ICD-10-CM | POA: Diagnosis not present

## 2019-02-08 DIAGNOSIS — H04123 Dry eye syndrome of bilateral lacrimal glands: Secondary | ICD-10-CM | POA: Diagnosis not present

## 2019-02-28 ENCOUNTER — Other Ambulatory Visit: Payer: Self-pay

## 2019-02-28 ENCOUNTER — Ambulatory Visit: Payer: Self-pay | Attending: Internal Medicine

## 2019-02-28 DIAGNOSIS — Z23 Encounter for immunization: Secondary | ICD-10-CM | POA: Insufficient documentation

## 2019-02-28 NOTE — Progress Notes (Signed)
   Covid-19 Vaccination Clinic  Name:  Johnny Johns    MRN: [id number] DOB: 25-May-1950  02/28/2019  Mr. Murgia was observed post Covid-19 immunization for 15 minutes without incidence. He was provided with Vaccine Information Sheet and instruction to access the V-Safe system.   Mr. Cilento was instructed to call 911 with any severe reactions post vaccine: Marland Kitchen Difficulty breathing  . Swelling of your face and throat  . A fast heartbeat  . A bad rash all over your body  . Dizziness and weakness    Immunizations Administered    Name Date Dose VIS Date Route   Moderna COVID-19 Vaccine 02/28/2019 10:17 AM 0.5 mL 12/12/2018 Intramuscular   Manufacturer: Moderna   Lot: [id number]   NDC: 17530-104-04

## 2019-03-27 DIAGNOSIS — M791 Myalgia, unspecified site: Secondary | ICD-10-CM | POA: Diagnosis not present

## 2019-03-27 DIAGNOSIS — I10 Essential (primary) hypertension: Secondary | ICD-10-CM | POA: Diagnosis not present

## 2019-03-27 DIAGNOSIS — M2550 Pain in unspecified joint: Secondary | ICD-10-CM | POA: Diagnosis not present

## 2019-03-28 ENCOUNTER — Ambulatory Visit: Payer: Self-pay | Attending: Internal Medicine

## 2019-03-28 DIAGNOSIS — Z23 Encounter for immunization: Secondary | ICD-10-CM

## 2019-03-28 NOTE — Progress Notes (Signed)
   Covid-19 Vaccination Clinic  Name:  SHERRI LEVENHAGEN    MRN: [id number] DOB: August 27, 1950  03/28/2019  Mr. Burgo was observed post Covid-19 immunization for 15 minutes without incident. He was provided with Vaccine Information Sheet and instruction to access the V-Safe system.   Mr. Carducci was instructed to call 911 with any severe reactions post vaccine: Marland Kitchen Difficulty breathing  . Swelling of face and throat  . A fast heartbeat  . A bad rash all over body  . Dizziness and weakness   Immunizations Administered    Name Date Dose VIS Date Route   Moderna COVID-19 Vaccine 03/28/2019  9:59 AM 0.5 mL 12/12/2018 Intramuscular   Manufacturer: Moderna   Lot: [id number]   NDC: 83437-357-89

## 2019-04-10 DIAGNOSIS — I10 Essential (primary) hypertension: Secondary | ICD-10-CM | POA: Diagnosis not present

## 2019-04-10 DIAGNOSIS — I2510 Atherosclerotic heart disease of native coronary artery without angina pectoris: Secondary | ICD-10-CM | POA: Diagnosis not present

## 2019-07-27 ENCOUNTER — Other Ambulatory Visit: Payer: Self-pay

## 2019-08-30 DIAGNOSIS — N390 Urinary tract infection, site not specified: Secondary | ICD-10-CM | POA: Diagnosis not present

## 2019-08-30 DIAGNOSIS — R252 Cramp and spasm: Secondary | ICD-10-CM | POA: Diagnosis not present

## 2019-08-30 DIAGNOSIS — I10 Essential (primary) hypertension: Secondary | ICD-10-CM | POA: Diagnosis not present

## 2019-10-31 DIAGNOSIS — Z23 Encounter for immunization: Secondary | ICD-10-CM | POA: Diagnosis not present

## 2019-12-04 DIAGNOSIS — M5416 Radiculopathy, lumbar region: Secondary | ICD-10-CM | POA: Diagnosis not present

## 2019-12-21 DIAGNOSIS — T466X5A Adverse effect of antihyperlipidemic and antiarteriosclerotic drugs, initial encounter: Secondary | ICD-10-CM | POA: Diagnosis not present

## 2019-12-21 DIAGNOSIS — I2510 Atherosclerotic heart disease of native coronary artery without angina pectoris: Secondary | ICD-10-CM | POA: Diagnosis not present

## 2019-12-21 DIAGNOSIS — Z79899 Other long term (current) drug therapy: Secondary | ICD-10-CM | POA: Diagnosis not present

## 2019-12-21 DIAGNOSIS — I10 Essential (primary) hypertension: Secondary | ICD-10-CM | POA: Diagnosis not present

## 2019-12-21 DIAGNOSIS — E785 Hyperlipidemia, unspecified: Secondary | ICD-10-CM | POA: Diagnosis not present

## 2019-12-21 DIAGNOSIS — R7301 Impaired fasting glucose: Secondary | ICD-10-CM | POA: Diagnosis not present

## 2019-12-21 DIAGNOSIS — Z125 Encounter for screening for malignant neoplasm of prostate: Secondary | ICD-10-CM | POA: Diagnosis not present

## 2019-12-28 DIAGNOSIS — E785 Hyperlipidemia, unspecified: Secondary | ICD-10-CM | POA: Diagnosis not present

## 2019-12-28 DIAGNOSIS — M542 Cervicalgia: Secondary | ICD-10-CM | POA: Diagnosis not present

## 2019-12-28 DIAGNOSIS — R7301 Impaired fasting glucose: Secondary | ICD-10-CM | POA: Diagnosis not present

## 2020-01-22 ENCOUNTER — Other Ambulatory Visit: Payer: Self-pay

## 2020-01-22 ENCOUNTER — Other Ambulatory Visit (HOSPITAL_COMMUNITY): Payer: Self-pay | Admitting: Internal Medicine

## 2020-01-22 ENCOUNTER — Ambulatory Visit (HOSPITAL_COMMUNITY)
Admission: RE | Admit: 2020-01-22 | Discharge: 2020-01-22 | Disposition: A | Discharge status: Home / Self Care | Payer: Medicare PPO | Source: Ambulatory Visit | Attending: Internal Medicine | Admitting: Internal Medicine

## 2020-01-22 DIAGNOSIS — M542 Cervicalgia: Secondary | ICD-10-CM | POA: Diagnosis not present

## 2020-01-22 DIAGNOSIS — M79672 Pain in left foot: Secondary | ICD-10-CM

## 2020-01-23 DIAGNOSIS — M9902 Segmental and somatic dysfunction of thoracic region: Secondary | ICD-10-CM | POA: Diagnosis not present

## 2020-01-23 DIAGNOSIS — M546 Pain in thoracic spine: Secondary | ICD-10-CM | POA: Diagnosis not present

## 2020-01-23 DIAGNOSIS — M542 Cervicalgia: Secondary | ICD-10-CM | POA: Diagnosis not present

## 2020-01-23 DIAGNOSIS — M9901 Segmental and somatic dysfunction of cervical region: Secondary | ICD-10-CM | POA: Diagnosis not present

## 2020-01-25 DIAGNOSIS — M9902 Segmental and somatic dysfunction of thoracic region: Secondary | ICD-10-CM | POA: Diagnosis not present

## 2020-01-25 DIAGNOSIS — M546 Pain in thoracic spine: Secondary | ICD-10-CM | POA: Diagnosis not present

## 2020-01-25 DIAGNOSIS — M9901 Segmental and somatic dysfunction of cervical region: Secondary | ICD-10-CM | POA: Diagnosis not present

## 2020-01-25 DIAGNOSIS — M542 Cervicalgia: Secondary | ICD-10-CM | POA: Diagnosis not present

## 2020-01-29 DIAGNOSIS — M9901 Segmental and somatic dysfunction of cervical region: Secondary | ICD-10-CM | POA: Diagnosis not present

## 2020-01-29 DIAGNOSIS — M542 Cervicalgia: Secondary | ICD-10-CM | POA: Diagnosis not present

## 2020-01-29 DIAGNOSIS — M9902 Segmental and somatic dysfunction of thoracic region: Secondary | ICD-10-CM | POA: Diagnosis not present

## 2020-01-29 DIAGNOSIS — M546 Pain in thoracic spine: Secondary | ICD-10-CM | POA: Diagnosis not present

## 2020-01-30 DIAGNOSIS — M9902 Segmental and somatic dysfunction of thoracic region: Secondary | ICD-10-CM | POA: Diagnosis not present

## 2020-01-30 DIAGNOSIS — M542 Cervicalgia: Secondary | ICD-10-CM | POA: Diagnosis not present

## 2020-01-30 DIAGNOSIS — M546 Pain in thoracic spine: Secondary | ICD-10-CM | POA: Diagnosis not present

## 2020-01-30 DIAGNOSIS — M9901 Segmental and somatic dysfunction of cervical region: Secondary | ICD-10-CM | POA: Diagnosis not present

## 2020-02-04 DIAGNOSIS — M546 Pain in thoracic spine: Secondary | ICD-10-CM | POA: Diagnosis not present

## 2020-02-04 DIAGNOSIS — M9902 Segmental and somatic dysfunction of thoracic region: Secondary | ICD-10-CM | POA: Diagnosis not present

## 2020-02-04 DIAGNOSIS — M9901 Segmental and somatic dysfunction of cervical region: Secondary | ICD-10-CM | POA: Diagnosis not present

## 2020-02-04 DIAGNOSIS — M542 Cervicalgia: Secondary | ICD-10-CM | POA: Diagnosis not present

## 2020-02-06 DIAGNOSIS — M9901 Segmental and somatic dysfunction of cervical region: Secondary | ICD-10-CM | POA: Diagnosis not present

## 2020-02-06 DIAGNOSIS — M546 Pain in thoracic spine: Secondary | ICD-10-CM | POA: Diagnosis not present

## 2020-02-06 DIAGNOSIS — M9902 Segmental and somatic dysfunction of thoracic region: Secondary | ICD-10-CM | POA: Diagnosis not present

## 2020-02-06 DIAGNOSIS — M542 Cervicalgia: Secondary | ICD-10-CM | POA: Diagnosis not present

## 2020-02-18 DIAGNOSIS — M542 Cervicalgia: Secondary | ICD-10-CM | POA: Diagnosis not present

## 2020-02-18 DIAGNOSIS — M546 Pain in thoracic spine: Secondary | ICD-10-CM | POA: Diagnosis not present

## 2020-02-18 DIAGNOSIS — M9901 Segmental and somatic dysfunction of cervical region: Secondary | ICD-10-CM | POA: Diagnosis not present

## 2020-02-18 DIAGNOSIS — M9902 Segmental and somatic dysfunction of thoracic region: Secondary | ICD-10-CM | POA: Diagnosis not present

## 2020-02-20 DIAGNOSIS — M546 Pain in thoracic spine: Secondary | ICD-10-CM | POA: Diagnosis not present

## 2020-02-20 DIAGNOSIS — M542 Cervicalgia: Secondary | ICD-10-CM | POA: Diagnosis not present

## 2020-02-20 DIAGNOSIS — M9902 Segmental and somatic dysfunction of thoracic region: Secondary | ICD-10-CM | POA: Diagnosis not present

## 2020-02-20 DIAGNOSIS — M9901 Segmental and somatic dysfunction of cervical region: Secondary | ICD-10-CM | POA: Diagnosis not present

## 2020-03-07 DIAGNOSIS — M542 Cervicalgia: Secondary | ICD-10-CM | POA: Diagnosis not present

## 2020-03-07 DIAGNOSIS — M9902 Segmental and somatic dysfunction of thoracic region: Secondary | ICD-10-CM | POA: Diagnosis not present

## 2020-03-07 DIAGNOSIS — M9901 Segmental and somatic dysfunction of cervical region: Secondary | ICD-10-CM | POA: Diagnosis not present

## 2020-03-07 DIAGNOSIS — M546 Pain in thoracic spine: Secondary | ICD-10-CM | POA: Diagnosis not present

## 2020-03-21 DIAGNOSIS — Z79899 Other long term (current) drug therapy: Secondary | ICD-10-CM | POA: Diagnosis not present

## 2020-03-21 DIAGNOSIS — M9902 Segmental and somatic dysfunction of thoracic region: Secondary | ICD-10-CM | POA: Diagnosis not present

## 2020-03-21 DIAGNOSIS — M542 Cervicalgia: Secondary | ICD-10-CM | POA: Diagnosis not present

## 2020-03-21 DIAGNOSIS — M546 Pain in thoracic spine: Secondary | ICD-10-CM | POA: Diagnosis not present

## 2020-03-21 DIAGNOSIS — M9901 Segmental and somatic dysfunction of cervical region: Secondary | ICD-10-CM | POA: Diagnosis not present

## 2020-03-21 DIAGNOSIS — E785 Hyperlipidemia, unspecified: Secondary | ICD-10-CM | POA: Diagnosis not present

## 2020-03-31 DIAGNOSIS — M542 Cervicalgia: Secondary | ICD-10-CM | POA: Diagnosis not present

## 2020-03-31 DIAGNOSIS — M9902 Segmental and somatic dysfunction of thoracic region: Secondary | ICD-10-CM | POA: Diagnosis not present

## 2020-03-31 DIAGNOSIS — M546 Pain in thoracic spine: Secondary | ICD-10-CM | POA: Diagnosis not present

## 2020-03-31 DIAGNOSIS — M9901 Segmental and somatic dysfunction of cervical region: Secondary | ICD-10-CM | POA: Diagnosis not present

## 2020-04-07 DIAGNOSIS — E785 Hyperlipidemia, unspecified: Secondary | ICD-10-CM | POA: Diagnosis not present

## 2020-04-07 DIAGNOSIS — I10 Essential (primary) hypertension: Secondary | ICD-10-CM | POA: Diagnosis not present

## 2020-05-07 DIAGNOSIS — M546 Pain in thoracic spine: Secondary | ICD-10-CM | POA: Diagnosis not present

## 2020-05-07 DIAGNOSIS — M542 Cervicalgia: Secondary | ICD-10-CM | POA: Diagnosis not present

## 2020-05-07 DIAGNOSIS — M9902 Segmental and somatic dysfunction of thoracic region: Secondary | ICD-10-CM | POA: Diagnosis not present

## 2020-05-07 DIAGNOSIS — M9901 Segmental and somatic dysfunction of cervical region: Secondary | ICD-10-CM | POA: Diagnosis not present

## 2020-05-12 DIAGNOSIS — M542 Cervicalgia: Secondary | ICD-10-CM | POA: Diagnosis not present

## 2020-05-12 DIAGNOSIS — M9903 Segmental and somatic dysfunction of lumbar region: Secondary | ICD-10-CM | POA: Diagnosis not present

## 2020-05-12 DIAGNOSIS — M9902 Segmental and somatic dysfunction of thoracic region: Secondary | ICD-10-CM | POA: Diagnosis not present

## 2020-05-12 DIAGNOSIS — M9901 Segmental and somatic dysfunction of cervical region: Secondary | ICD-10-CM | POA: Diagnosis not present

## 2020-05-12 DIAGNOSIS — M546 Pain in thoracic spine: Secondary | ICD-10-CM | POA: Diagnosis not present

## 2020-05-21 DIAGNOSIS — M542 Cervicalgia: Secondary | ICD-10-CM | POA: Diagnosis not present

## 2020-05-21 DIAGNOSIS — M9903 Segmental and somatic dysfunction of lumbar region: Secondary | ICD-10-CM | POA: Diagnosis not present

## 2020-05-21 DIAGNOSIS — M9901 Segmental and somatic dysfunction of cervical region: Secondary | ICD-10-CM | POA: Diagnosis not present

## 2020-05-21 DIAGNOSIS — M9902 Segmental and somatic dysfunction of thoracic region: Secondary | ICD-10-CM | POA: Diagnosis not present

## 2020-05-21 DIAGNOSIS — M546 Pain in thoracic spine: Secondary | ICD-10-CM | POA: Diagnosis not present

## 2020-05-22 DIAGNOSIS — I10 Essential (primary) hypertension: Secondary | ICD-10-CM | POA: Diagnosis not present

## 2020-05-28 DIAGNOSIS — M9901 Segmental and somatic dysfunction of cervical region: Secondary | ICD-10-CM | POA: Diagnosis not present

## 2020-05-28 DIAGNOSIS — M9902 Segmental and somatic dysfunction of thoracic region: Secondary | ICD-10-CM | POA: Diagnosis not present

## 2020-05-28 DIAGNOSIS — M9903 Segmental and somatic dysfunction of lumbar region: Secondary | ICD-10-CM | POA: Diagnosis not present

## 2020-05-28 DIAGNOSIS — M546 Pain in thoracic spine: Secondary | ICD-10-CM | POA: Diagnosis not present

## 2020-05-28 DIAGNOSIS — M542 Cervicalgia: Secondary | ICD-10-CM | POA: Diagnosis not present

## 2020-06-16 DIAGNOSIS — M9901 Segmental and somatic dysfunction of cervical region: Secondary | ICD-10-CM | POA: Diagnosis not present

## 2020-06-16 DIAGNOSIS — M9902 Segmental and somatic dysfunction of thoracic region: Secondary | ICD-10-CM | POA: Diagnosis not present

## 2020-06-16 DIAGNOSIS — M9903 Segmental and somatic dysfunction of lumbar region: Secondary | ICD-10-CM | POA: Diagnosis not present

## 2020-06-16 DIAGNOSIS — M546 Pain in thoracic spine: Secondary | ICD-10-CM | POA: Diagnosis not present

## 2020-06-16 DIAGNOSIS — M542 Cervicalgia: Secondary | ICD-10-CM | POA: Diagnosis not present

## 2020-06-18 DIAGNOSIS — M9903 Segmental and somatic dysfunction of lumbar region: Secondary | ICD-10-CM | POA: Diagnosis not present

## 2020-06-18 DIAGNOSIS — M9901 Segmental and somatic dysfunction of cervical region: Secondary | ICD-10-CM | POA: Diagnosis not present

## 2020-06-18 DIAGNOSIS — M9902 Segmental and somatic dysfunction of thoracic region: Secondary | ICD-10-CM | POA: Diagnosis not present

## 2020-06-18 DIAGNOSIS — M542 Cervicalgia: Secondary | ICD-10-CM | POA: Diagnosis not present

## 2020-06-18 DIAGNOSIS — M546 Pain in thoracic spine: Secondary | ICD-10-CM | POA: Diagnosis not present

## 2020-06-30 DIAGNOSIS — M9901 Segmental and somatic dysfunction of cervical region: Secondary | ICD-10-CM | POA: Diagnosis not present

## 2020-06-30 DIAGNOSIS — M9902 Segmental and somatic dysfunction of thoracic region: Secondary | ICD-10-CM | POA: Diagnosis not present

## 2020-06-30 DIAGNOSIS — M542 Cervicalgia: Secondary | ICD-10-CM | POA: Diagnosis not present

## 2020-06-30 DIAGNOSIS — M546 Pain in thoracic spine: Secondary | ICD-10-CM | POA: Diagnosis not present

## 2020-06-30 DIAGNOSIS — M9903 Segmental and somatic dysfunction of lumbar region: Secondary | ICD-10-CM | POA: Diagnosis not present

## 2020-08-01 DIAGNOSIS — M546 Pain in thoracic spine: Secondary | ICD-10-CM | POA: Diagnosis not present

## 2020-08-01 DIAGNOSIS — M9902 Segmental and somatic dysfunction of thoracic region: Secondary | ICD-10-CM | POA: Diagnosis not present

## 2020-08-01 DIAGNOSIS — M9901 Segmental and somatic dysfunction of cervical region: Secondary | ICD-10-CM | POA: Diagnosis not present

## 2020-08-01 DIAGNOSIS — M542 Cervicalgia: Secondary | ICD-10-CM | POA: Diagnosis not present

## 2020-08-18 DIAGNOSIS — M542 Cervicalgia: Secondary | ICD-10-CM | POA: Diagnosis not present

## 2020-08-18 DIAGNOSIS — M546 Pain in thoracic spine: Secondary | ICD-10-CM | POA: Diagnosis not present

## 2020-08-18 DIAGNOSIS — M9902 Segmental and somatic dysfunction of thoracic region: Secondary | ICD-10-CM | POA: Diagnosis not present

## 2020-08-18 DIAGNOSIS — M9901 Segmental and somatic dysfunction of cervical region: Secondary | ICD-10-CM | POA: Diagnosis not present

## 2020-08-22 DIAGNOSIS — M542 Cervicalgia: Secondary | ICD-10-CM | POA: Diagnosis not present

## 2020-08-22 DIAGNOSIS — M546 Pain in thoracic spine: Secondary | ICD-10-CM | POA: Diagnosis not present

## 2020-08-22 DIAGNOSIS — M9902 Segmental and somatic dysfunction of thoracic region: Secondary | ICD-10-CM | POA: Diagnosis not present

## 2020-08-22 DIAGNOSIS — M9901 Segmental and somatic dysfunction of cervical region: Secondary | ICD-10-CM | POA: Diagnosis not present

## 2020-08-25 DIAGNOSIS — M9901 Segmental and somatic dysfunction of cervical region: Secondary | ICD-10-CM | POA: Diagnosis not present

## 2020-08-25 DIAGNOSIS — M542 Cervicalgia: Secondary | ICD-10-CM | POA: Diagnosis not present

## 2020-08-25 DIAGNOSIS — M546 Pain in thoracic spine: Secondary | ICD-10-CM | POA: Diagnosis not present

## 2020-08-25 DIAGNOSIS — M9902 Segmental and somatic dysfunction of thoracic region: Secondary | ICD-10-CM | POA: Diagnosis not present

## 2020-09-02 DIAGNOSIS — I10 Essential (primary) hypertension: Secondary | ICD-10-CM | POA: Diagnosis not present

## 2020-09-02 DIAGNOSIS — I2510 Atherosclerotic heart disease of native coronary artery without angina pectoris: Secondary | ICD-10-CM | POA: Diagnosis not present

## 2020-09-16 DIAGNOSIS — M9901 Segmental and somatic dysfunction of cervical region: Secondary | ICD-10-CM | POA: Diagnosis not present

## 2020-09-16 DIAGNOSIS — M9902 Segmental and somatic dysfunction of thoracic region: Secondary | ICD-10-CM | POA: Diagnosis not present

## 2020-09-16 DIAGNOSIS — M546 Pain in thoracic spine: Secondary | ICD-10-CM | POA: Diagnosis not present

## 2020-09-16 DIAGNOSIS — M542 Cervicalgia: Secondary | ICD-10-CM | POA: Diagnosis not present

## 2020-09-29 DIAGNOSIS — M546 Pain in thoracic spine: Secondary | ICD-10-CM | POA: Diagnosis not present

## 2020-09-29 DIAGNOSIS — M9902 Segmental and somatic dysfunction of thoracic region: Secondary | ICD-10-CM | POA: Diagnosis not present

## 2020-09-29 DIAGNOSIS — M542 Cervicalgia: Secondary | ICD-10-CM | POA: Diagnosis not present

## 2020-09-29 DIAGNOSIS — M9901 Segmental and somatic dysfunction of cervical region: Secondary | ICD-10-CM | POA: Diagnosis not present

## 2020-10-03 DIAGNOSIS — M546 Pain in thoracic spine: Secondary | ICD-10-CM | POA: Diagnosis not present

## 2020-10-03 DIAGNOSIS — M542 Cervicalgia: Secondary | ICD-10-CM | POA: Diagnosis not present

## 2020-10-03 DIAGNOSIS — M9901 Segmental and somatic dysfunction of cervical region: Secondary | ICD-10-CM | POA: Diagnosis not present

## 2020-10-03 DIAGNOSIS — M9902 Segmental and somatic dysfunction of thoracic region: Secondary | ICD-10-CM | POA: Diagnosis not present

## 2020-10-06 DIAGNOSIS — M546 Pain in thoracic spine: Secondary | ICD-10-CM | POA: Diagnosis not present

## 2020-10-06 DIAGNOSIS — M9902 Segmental and somatic dysfunction of thoracic region: Secondary | ICD-10-CM | POA: Diagnosis not present

## 2020-10-06 DIAGNOSIS — M9901 Segmental and somatic dysfunction of cervical region: Secondary | ICD-10-CM | POA: Diagnosis not present

## 2020-10-06 DIAGNOSIS — M542 Cervicalgia: Secondary | ICD-10-CM | POA: Diagnosis not present

## 2020-10-10 DIAGNOSIS — M9901 Segmental and somatic dysfunction of cervical region: Secondary | ICD-10-CM | POA: Diagnosis not present

## 2020-10-10 DIAGNOSIS — M546 Pain in thoracic spine: Secondary | ICD-10-CM | POA: Diagnosis not present

## 2020-10-10 DIAGNOSIS — M542 Cervicalgia: Secondary | ICD-10-CM | POA: Diagnosis not present

## 2020-10-10 DIAGNOSIS — M9902 Segmental and somatic dysfunction of thoracic region: Secondary | ICD-10-CM | POA: Diagnosis not present

## 2020-10-15 DIAGNOSIS — M9901 Segmental and somatic dysfunction of cervical region: Secondary | ICD-10-CM | POA: Diagnosis not present

## 2020-10-15 DIAGNOSIS — M542 Cervicalgia: Secondary | ICD-10-CM | POA: Diagnosis not present

## 2020-10-15 DIAGNOSIS — M545 Low back pain, unspecified: Secondary | ICD-10-CM | POA: Diagnosis not present

## 2020-10-15 DIAGNOSIS — M9903 Segmental and somatic dysfunction of lumbar region: Secondary | ICD-10-CM | POA: Diagnosis not present

## 2020-10-27 DIAGNOSIS — M545 Low back pain, unspecified: Secondary | ICD-10-CM | POA: Diagnosis not present

## 2020-10-27 DIAGNOSIS — M9901 Segmental and somatic dysfunction of cervical region: Secondary | ICD-10-CM | POA: Diagnosis not present

## 2020-10-27 DIAGNOSIS — M542 Cervicalgia: Secondary | ICD-10-CM | POA: Diagnosis not present

## 2020-10-27 DIAGNOSIS — M9903 Segmental and somatic dysfunction of lumbar region: Secondary | ICD-10-CM | POA: Diagnosis not present

## 2020-11-10 DIAGNOSIS — M542 Cervicalgia: Secondary | ICD-10-CM | POA: Diagnosis not present

## 2020-11-10 DIAGNOSIS — M9901 Segmental and somatic dysfunction of cervical region: Secondary | ICD-10-CM | POA: Diagnosis not present

## 2020-11-10 DIAGNOSIS — M546 Pain in thoracic spine: Secondary | ICD-10-CM | POA: Diagnosis not present

## 2020-11-10 DIAGNOSIS — M9902 Segmental and somatic dysfunction of thoracic region: Secondary | ICD-10-CM | POA: Diagnosis not present

## 2020-12-02 DIAGNOSIS — M9902 Segmental and somatic dysfunction of thoracic region: Secondary | ICD-10-CM | POA: Diagnosis not present

## 2020-12-02 DIAGNOSIS — M542 Cervicalgia: Secondary | ICD-10-CM | POA: Diagnosis not present

## 2020-12-02 DIAGNOSIS — M546 Pain in thoracic spine: Secondary | ICD-10-CM | POA: Diagnosis not present

## 2020-12-02 DIAGNOSIS — M9901 Segmental and somatic dysfunction of cervical region: Secondary | ICD-10-CM | POA: Diagnosis not present

## 2020-12-08 DIAGNOSIS — Z23 Encounter for immunization: Secondary | ICD-10-CM | POA: Diagnosis not present

## 2020-12-25 DIAGNOSIS — Z79899 Other long term (current) drug therapy: Secondary | ICD-10-CM | POA: Diagnosis not present

## 2020-12-25 DIAGNOSIS — I10 Essential (primary) hypertension: Secondary | ICD-10-CM | POA: Diagnosis not present

## 2020-12-25 DIAGNOSIS — Z125 Encounter for screening for malignant neoplasm of prostate: Secondary | ICD-10-CM | POA: Diagnosis not present

## 2020-12-25 DIAGNOSIS — I2510 Atherosclerotic heart disease of native coronary artery without angina pectoris: Secondary | ICD-10-CM | POA: Diagnosis not present

## 2021-01-06 DIAGNOSIS — E785 Hyperlipidemia, unspecified: Secondary | ICD-10-CM | POA: Diagnosis not present

## 2021-01-06 DIAGNOSIS — I10 Essential (primary) hypertension: Secondary | ICD-10-CM | POA: Diagnosis not present

## 2021-01-06 DIAGNOSIS — R7309 Other abnormal glucose: Secondary | ICD-10-CM | POA: Diagnosis not present

## 2021-01-06 DIAGNOSIS — I2510 Atherosclerotic heart disease of native coronary artery without angina pectoris: Secondary | ICD-10-CM | POA: Diagnosis not present

## 2021-01-13 DIAGNOSIS — M546 Pain in thoracic spine: Secondary | ICD-10-CM | POA: Diagnosis not present

## 2021-01-13 DIAGNOSIS — M9901 Segmental and somatic dysfunction of cervical region: Secondary | ICD-10-CM | POA: Diagnosis not present

## 2021-01-13 DIAGNOSIS — M542 Cervicalgia: Secondary | ICD-10-CM | POA: Diagnosis not present

## 2021-01-13 DIAGNOSIS — M9902 Segmental and somatic dysfunction of thoracic region: Secondary | ICD-10-CM | POA: Diagnosis not present

## 2021-01-14 DIAGNOSIS — M9901 Segmental and somatic dysfunction of cervical region: Secondary | ICD-10-CM | POA: Diagnosis not present

## 2021-01-14 DIAGNOSIS — M546 Pain in thoracic spine: Secondary | ICD-10-CM | POA: Diagnosis not present

## 2021-01-14 DIAGNOSIS — M9902 Segmental and somatic dysfunction of thoracic region: Secondary | ICD-10-CM | POA: Diagnosis not present

## 2021-01-14 DIAGNOSIS — M542 Cervicalgia: Secondary | ICD-10-CM | POA: Diagnosis not present

## 2021-01-19 DIAGNOSIS — M546 Pain in thoracic spine: Secondary | ICD-10-CM | POA: Diagnosis not present

## 2021-01-19 DIAGNOSIS — M542 Cervicalgia: Secondary | ICD-10-CM | POA: Diagnosis not present

## 2021-01-19 DIAGNOSIS — M9902 Segmental and somatic dysfunction of thoracic region: Secondary | ICD-10-CM | POA: Diagnosis not present

## 2021-01-19 DIAGNOSIS — M9901 Segmental and somatic dysfunction of cervical region: Secondary | ICD-10-CM | POA: Diagnosis not present

## 2021-01-20 DIAGNOSIS — R319 Hematuria, unspecified: Secondary | ICD-10-CM | POA: Diagnosis not present

## 2021-01-20 DIAGNOSIS — M545 Low back pain, unspecified: Secondary | ICD-10-CM | POA: Diagnosis not present

## 2021-01-21 DIAGNOSIS — M9901 Segmental and somatic dysfunction of cervical region: Secondary | ICD-10-CM | POA: Diagnosis not present

## 2021-01-21 DIAGNOSIS — M9902 Segmental and somatic dysfunction of thoracic region: Secondary | ICD-10-CM | POA: Diagnosis not present

## 2021-01-21 DIAGNOSIS — M542 Cervicalgia: Secondary | ICD-10-CM | POA: Diagnosis not present

## 2021-01-21 DIAGNOSIS — M546 Pain in thoracic spine: Secondary | ICD-10-CM | POA: Diagnosis not present

## 2021-01-26 ENCOUNTER — Other Ambulatory Visit (HOSPITAL_COMMUNITY): Payer: Self-pay | Admitting: Internal Medicine

## 2021-01-26 DIAGNOSIS — M546 Pain in thoracic spine: Secondary | ICD-10-CM | POA: Diagnosis not present

## 2021-01-26 DIAGNOSIS — M9903 Segmental and somatic dysfunction of lumbar region: Secondary | ICD-10-CM | POA: Diagnosis not present

## 2021-01-26 DIAGNOSIS — M9901 Segmental and somatic dysfunction of cervical region: Secondary | ICD-10-CM | POA: Diagnosis not present

## 2021-01-26 DIAGNOSIS — M542 Cervicalgia: Secondary | ICD-10-CM | POA: Diagnosis not present

## 2021-01-26 DIAGNOSIS — R319 Hematuria, unspecified: Secondary | ICD-10-CM

## 2021-01-26 DIAGNOSIS — M9902 Segmental and somatic dysfunction of thoracic region: Secondary | ICD-10-CM | POA: Diagnosis not present

## 2021-02-02 ENCOUNTER — Other Ambulatory Visit: Payer: Self-pay

## 2021-02-02 ENCOUNTER — Ambulatory Visit (HOSPITAL_COMMUNITY)
Admission: RE | Admit: 2021-02-02 | Discharge: 2021-02-02 | Disposition: A | Discharge status: Home / Self Care | Payer: Medicare PPO | Source: Ambulatory Visit | Attending: Internal Medicine | Admitting: Internal Medicine

## 2021-02-02 DIAGNOSIS — R319 Hematuria, unspecified: Secondary | ICD-10-CM | POA: Diagnosis not present

## 2021-02-02 DIAGNOSIS — I700 Atherosclerosis of aorta: Secondary | ICD-10-CM | POA: Diagnosis not present

## 2021-02-02 DIAGNOSIS — R109 Unspecified abdominal pain: Secondary | ICD-10-CM | POA: Diagnosis not present

## 2021-02-02 LAB — POCT I-STAT CREATININE: Creatinine, Ser: 2.2 mg/dL — ABNORMAL HIGH (ref 0.61–1.24)

## 2021-02-02 MED ORDER — IOHEXOL 300 MG/ML  SOLN
80.0000 mL | Freq: Once | INTRAMUSCULAR | Status: AC | PRN
  Administered 2021-02-02: 80 mL via INTRAVENOUS

## 2021-02-03 ENCOUNTER — Encounter: Payer: Self-pay | Admitting: Urology

## 2021-02-03 ENCOUNTER — Telehealth: Payer: Self-pay

## 2021-02-03 ENCOUNTER — Ambulatory Visit: Payer: Medicare PPO | Admitting: Urology

## 2021-02-03 VITALS — BP 122/79 | HR 78 | Wt 241.6 lb

## 2021-02-03 DIAGNOSIS — N200 Calculus of kidney: Secondary | ICD-10-CM

## 2021-02-03 LAB — MICROSCOPIC EXAMINATION
Renal Epithel, UA: NONE SEEN /hpf
WBC, UA: NONE SEEN /hpf (ref 0–5)

## 2021-02-03 LAB — URINALYSIS, ROUTINE W REFLEX MICROSCOPIC
Bilirubin, UA: NEGATIVE
Glucose, UA: NEGATIVE
Ketones, UA: NEGATIVE
Leukocytes,UA: NEGATIVE
Nitrite, UA: NEGATIVE
Specific Gravity, UA: 1.02 (ref 1.005–1.030)
Urobilinogen, Ur: 0.2 mg/dL (ref 0.2–1.0)
pH, UA: 5.5 (ref 5.0–7.5)

## 2021-02-03 NOTE — Telephone Encounter (Signed)
-----   Message from Franchot Gallo, MD sent at 02/02/2021  5:18 PM EST ----- I was called by Dr Willey Blade on [facility] man has a large stone as well as elevated creatinine. Can we get him seen on 1/24?

## 2021-02-03 NOTE — Progress Notes (Signed)
H&P ° °Chief Complaint: Right-sided kidney stone ° °History of Present Illness: 71-year-old male with history of 1 prior stone many years ago presents today, referred by Dr. Roy Fagan for evaluation and management of right upper/mid ureteral calculus.  For about 10 days he has had intermittent gross painless hematuria.  He went to [place] yesterday for hematuria protocol CT scan.  Prior to the scan his creatinine was 2.2.  By history from Dr. Fagan, he has had normal creatinine in the past. ° °CT revealed an obstructing right proximal ureteral stone, 7.7 mm in size.  There was associated right hydroureteronephrosis.  No other calculi seen. ° °He has no current pain.  No lower urinary tract symptoms, fever, chills, nausea or vomiting. ° °Past Medical History:  °Diagnosis Date  ° Arteriosclerotic cardiovascular disease (ASCVD) 2005  ° Presented with acute myocardial infarction In 01/2003; isolated 90% proximal LAD lesion treated with DES; EF 45-50%  ° Degenerative joint disease   ° Chronic neck and back pain  ° Hyperlipidemia   ° lipid profile in 06/2010:195, 207, 37, 117  ° IBS (irritable bowel syndrome)   ° PUD (peptic ulcer disease)   ° hx  ° Tobacco abuse, in remission   ° ° °Past Surgical History:  °Procedure Laterality Date  ° COLONOSCOPY  2008  ° KIDNEY STONE SURGERY  1990  ° ° °Home Medications:  °Allergies as of 02/03/2021   ° °   Reactions  ° Gemfibrozil   ° Metoprolol Other (See Comments)  ° Myalgias and neck pain  ° Simvastatin Other (See Comments)  ° Myalgias; also Vytorin, pravastatin and Lipitor  ° Statins Other (See Comments)  ° Muscle aches  ° Vioxx [rofecoxib]   ° °  ° °  °Medication List  °  ° °  ° Accurate as of February 03, 2021  9:44 AM. If you have any questions, ask your nurse or doctor.  °  °  ° °  ° °aspirin 81 MG tablet °Take 81 mg by mouth daily. °  °valsartan 320 MG tablet °Commonly known as: DIOVAN °Take 320 mg by mouth at bedtime. °  ° °  ° ° °Allergies:  °Allergies  °Allergen Reactions   ° Gemfibrozil   ° Metoprolol Other (See Comments)  °  Myalgias and neck pain  ° Simvastatin Other (See Comments)  °  Myalgias; also Vytorin, pravastatin and Lipitor  ° Statins Other (See Comments)  °  Muscle aches  ° Vioxx [Rofecoxib]   ° ° °No family history on file. ° °Social History:  reports that he has quit smoking. He has never used smokeless tobacco. He reports that he does not drink alcohol and does not use drugs. ° °ROS: °A complete review of systems was performed.  All systems are negative except for pertinent findings as noted. ° °Physical Exam:  °Vital signs in last 24 hours: °BP 122/79    Pulse 78    Wt 241 lb 9.6 oz (109.6 kg)    BMI 33.70 kg/m²  °Constitutional:  Alert and oriented, No acute distress °Cardiovascular: Regular rate  °Respiratory: Normal respiratory effort °Neurologic: Grossly intact, no focal deficits °Psychiatric: Normal mood and affect ° °I have reviewed prior pt notes ° °I have reviewed notes from referring/previous physicians--Dr. Fagan ° °I have reviewed urinalysis results ° °I have independently reviewed prior imaging--I reviewed CT images with the patient as well as Dr. Stoneking ° ° ° °Impression/Assessment:  °1.  Obstructing right proximal ureteral stone ° °2.  Mild   renal insufficiency-he was also given IV contrast yesterday ° °Plan:  °1.  I will check his basic metabolic panel today ° °2.  I discussed management with him-MET, shockwave lithotripsy and ureteroscopy.  As the stone is not readily evident on the scout film, I suggest that he proceed with ureteroscopy, holmium laser lithotripsy and stenting.  This was discussed with the patient as well as possible risks and complications. ° °3.  I discussed him with Dr. Stoneking as well.  We will try to put him on for this procedure in [place] on Friday. ° °

## 2021-02-03 NOTE — Telephone Encounter (Signed)
Patient has appt today to see MD. Patient aware and on his way.

## 2021-02-03 NOTE — Progress Notes (Signed)
Urological Symptom Review  Patient is experiencing the following symptoms: Get up at night to urinate Blood in urine   Review of Systems  Gastrointestinal (upper)  : Negative for upper GI symptoms  Gastrointestinal (lower) : Negative for lower GI symptoms  Constitutional : Negative for symptoms  Skin: Negative for skin symptoms  Eyes: Negative for eye symptoms  Ear/Nose/Throat : Negative for Ear/Nose/Throat symptoms  Hematologic/Lymphatic: Negative for Hematologic/Lymphatic symptoms  Cardiovascular : Negative for cardiovascular symptoms  Respiratory : Negative for respiratory symptoms  Endocrine: Negative for endocrine symptoms  Musculoskeletal: Negative for musculoskeletal symptoms  Neurological: Negative for neurological symptoms  Psychologic: Negative for psychiatric symptoms  

## 2021-02-03 NOTE — H&P (View-Only) (Signed)
H&P  Chief Complaint: Right-sided kidney stone  History of Present Illness: 71 year old male with history of 1 prior stone many years ago presents today, referred by Dr. Asencion Noble for evaluation and management of right upper/mid ureteral calculus.  For about 10 days he has had intermittent gross painless hematuria.  He went to [facility] yesterday for hematuria protocol CT scan.  Prior to the scan his creatinine was 2.2.  By history from Dr. Willey Blade, he has had normal creatinine in the past.  CT revealed an obstructing right proximal ureteral stone, 7.7 mm in size.  There was associated right hydroureteronephrosis.  No other calculi seen.  He has no current pain.  No lower urinary tract symptoms, fever, chills, nausea or vomiting.  Past Medical History:  Diagnosis Date   Arteriosclerotic cardiovascular disease (ASCVD) 2005   Presented with acute myocardial infarction In 01/2003; isolated 90% proximal LAD lesion treated with DES; EF 45-50%   Degenerative joint disease    Chronic neck and back pain   Hyperlipidemia    lipid profile in 06/2010:195, 207, 37, 117   IBS (irritable bowel syndrome)    PUD (peptic ulcer disease)    hx   Tobacco abuse, in remission     Past Surgical History:  Procedure Laterality Date   COLONOSCOPY  2008   KIDNEY STONE SURGERY  1990    Home Medications:  Allergies as of 02/03/2021       Reactions   Gemfibrozil    Metoprolol Other (See Comments)   Myalgias and neck pain   Simvastatin Other (See Comments)   Myalgias; also Vytorin, pravastatin and Lipitor   Statins Other (See Comments)   Muscle aches   Vioxx [rofecoxib]         Medication List        Accurate as of February 03, 2021  9:44 AM. If you have any questions, ask your nurse or doctor.          aspirin 81 MG tablet Take 81 mg by mouth daily.   valsartan 320 MG tablet Commonly known as: DIOVAN Take 320 mg by mouth at bedtime.        Allergies:  Allergies  Allergen Reactions    Gemfibrozil    Metoprolol Other (See Comments)    Myalgias and neck pain   Simvastatin Other (See Comments)    Myalgias; also Vytorin, pravastatin and Lipitor   Statins Other (See Comments)    Muscle aches   Vioxx [Rofecoxib]     No family history on file.  Social History:  reports that he has quit smoking. He has never used smokeless tobacco. He reports that he does not drink alcohol and does not use drugs.  ROS: A complete review of systems was performed.  All systems are negative except for pertinent findings as noted.  Physical Exam:  Vital signs in last 24 hours: BP 122/79    Pulse 78    Wt 241 lb 9.6 oz (109.6 kg)    BMI 33.70 kg/m  Constitutional:  Alert and oriented, No acute distress Cardiovascular: Regular rate  Respiratory: Normal respiratory effort Neurologic: Grossly intact, no focal deficits Psychiatric: Normal mood and affect  I have reviewed prior pt notes  I have reviewed notes from referring/previous physicians--Dr. Willey Blade  I have reviewed urinalysis results  I have independently reviewed prior imaging--I reviewed CT images with the patient as well as Dr. Felipa Eth    Impression/Assessment:  1.  Obstructing right proximal ureteral stone  2.  Mild  renal insufficiency-he was also given IV contrast yesterday  Plan:  1.  I will check his basic metabolic panel today  2.  I discussed management with him-MET, shockwave lithotripsy and ureteroscopy.  As the stone is not readily evident on the scout film, I suggest that he proceed with ureteroscopy, holmium laser lithotripsy and stenting.  This was discussed with the patient as well as possible risks and complications.  3.  I discussed him with Dr. Felipa Eth as well.  We will try to put him on for this procedure in [place] on Friday.

## 2021-02-04 LAB — BASIC METABOLIC PANEL
BUN/Creatinine Ratio: 15 (ref 10–24)
BUN: 29 mg/dL — ABNORMAL HIGH (ref 8–27)
CO2: 22 mmol/L (ref 20–29)
Calcium: 9.4 mg/dL (ref 8.6–10.2)
Chloride: 105 mmol/L (ref 96–106)
Creatinine, Ser: 1.97 mg/dL — ABNORMAL HIGH (ref 0.76–1.27)
Glucose: 108 mg/dL — ABNORMAL HIGH (ref 70–99)
Potassium: 5.7 mmol/L — ABNORMAL HIGH (ref 3.5–5.2)
Sodium: 140 mmol/L (ref 134–144)
eGFR: 35 mL/min/{1.73_m2} — ABNORMAL LOW (ref >59)

## 2021-02-05 ENCOUNTER — Encounter (HOSPITAL_COMMUNITY)
Admission: RE | Admit: 2021-02-05 | Discharge: 2021-02-05 | Disposition: A | Discharge status: Home / Self Care | Payer: Medicare PPO | Source: Ambulatory Visit | Attending: Urology | Admitting: Urology

## 2021-02-05 ENCOUNTER — Encounter (HOSPITAL_COMMUNITY): Payer: Self-pay

## 2021-02-05 ENCOUNTER — Encounter: Payer: Self-pay | Admitting: Orthopaedic Surgery

## 2021-02-05 ENCOUNTER — Ambulatory Visit (INDEPENDENT_AMBULATORY_CARE_PROVIDER_SITE_OTHER): Payer: Medicare PPO | Admitting: Orthopaedic Surgery

## 2021-02-05 ENCOUNTER — Other Ambulatory Visit: Payer: Self-pay

## 2021-02-05 VITALS — Ht 70.0 in | Wt 238.0 lb

## 2021-02-05 DIAGNOSIS — N200 Calculus of kidney: Secondary | ICD-10-CM

## 2021-02-05 DIAGNOSIS — M47812 Spondylosis without myelopathy or radiculopathy, cervical region: Secondary | ICD-10-CM

## 2021-02-05 DIAGNOSIS — M542 Cervicalgia: Secondary | ICD-10-CM

## 2021-02-05 DIAGNOSIS — Q761 Klippel-Feil syndrome: Secondary | ICD-10-CM

## 2021-02-05 DIAGNOSIS — N201 Calculus of ureter: Secondary | ICD-10-CM

## 2021-02-05 HISTORY — DX: Acute myocardial infarction, unspecified: I21.9

## 2021-02-05 HISTORY — DX: Essential (primary) hypertension: I10

## 2021-02-05 NOTE — Progress Notes (Signed)
Office Visit Note   Patient: Johnny Johns           Date of Birth: 27-Oct-1950           MRN: [id number] Visit Date: 02/05/2021              Requested by: Asencion Noble, MD [address] [place],  [place] [address] PCP: Asencion Noble, MD   Assessment & Plan: Visit Diagnoses:  1. Cervicalgia   2. Cervical spondylosis   3. Klippel-Feil deformity     Plan: Patient sent radiograph showed significant spondylosis at C5-6 C6-7 with endplate irregularity 1 to 2 mm of listhesis.  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  He is already been through chiropractic treatment anti-inflammatories with persistent increasing symptoms.  We will set him up for cervical MRI scan at [facility] and office follow-up after MRI imaging.  Follow-Up Instructions: No follow-ups on file.   Orders:  Orders Placed This Encounter  Procedures   MR Cervical Spine w/o contrast   No orders of the defined types were placed in this encounter.     Procedures: No procedures performed   Clinical Data: No additional findings.   Subjective: Chief Complaint  Patient presents with   Neck - Pain    HPI 71 year old male with progressive neck pain for last few months.  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  He has been treated by chiropractics for months taken over-the-counter anti-inflammatories without relief.  Previous heart attack 2005 he had a stent placed.  He takes valsartan for hypertension.  Patient works as a Geophysicist/field seismologist married does not smoke but was a past smoker.  He states he has had pain with rotation of his neck pain that radiates into his shoulders no numbness or tingling in his hand no myelopathic symptoms.  Neck is progressively become more painful.  Review of Systems positive for recurrence of kidney stone he has surgery scheduled within the next few days for stone zap, cystoscopy and stone removal.  All other systems are noncontributory other than as  mentioned in HPI.   Objective: Vital Signs: Ht 5\' 10"  (1.778 m)    Wt 238 lb (108 kg)    BMI 34.15 kg/m   Physical Exam Constitutional:      Appearance: He is well-developed.  HENT:     Head: Normocephalic and atraumatic.     Right Ear: External ear normal.     Left Ear: External ear normal.  Eyes:     Pupils: Pupils are equal, round, and reactive to light.  Neck:     Thyroid: No thyromegaly.     Trachea: No tracheal deviation.  Cardiovascular:     Rate and Rhythm: Normal rate.  Pulmonary:     Effort: Pulmonary effort is normal.     Breath sounds: No wheezing.  Abdominal:     General: Bowel sounds are normal.     Palpations: Abdomen is soft.  Musculoskeletal:     Cervical back: Neck supple.  Skin:    General: Skin is warm and dry.     Capillary Refill: Capillary refill takes less than 2 seconds.  Neurological:     Mental Status: He is alert and oriented to person, place, and time.  Psychiatric:        Behavior: Behavior normal.        Thought Content: Thought content normal.        Judgment: Judgment normal.    Ortho Exam 30%  limitation of range of motion.  Positive brachial plexus tenderness.  Upper extremity repeat flexes are 2+ and symmetrical.  Good grip strength.  Some tenderness at the base of the thumb with some CMC arthritis right hand more than the left.  No atrophy of the upper extremities normal heel toe gait.  Specialty Comments:  No specialty comments available.  Imaging: No results found.   PMFS History: Patient Active Problem List   Diagnosis Date Noted   Klippel-Feil deformity 02/05/2021   Arteriosclerotic cardiovascular disease (ASCVD)    Tobacco abuse, in remission    Hyperlipidemia    Degenerative joint disease    Irritable bowel syndrome 05/06/2009   Peptic ulcer disease 05/06/2009   Past Medical History:  Diagnosis Date   Arteriosclerotic cardiovascular disease (ASCVD) 01/12/2003   Presented with acute myocardial infarction In  01/2003; isolated 90% proximal LAD lesion treated with DES; EF 45-50%   Degenerative joint disease    Chronic neck and back pain   Hyperlipidemia    lipid profile in 06/2010:195, 207, 37, 117   Hypertension    IBS (irritable bowel syndrome)    Myocardial infarction (HCC)    PUD (peptic ulcer disease)    hx   Tobacco abuse, in remission     No family history on file.  Past Surgical History:  Procedure Laterality Date   CARDIAC CATHETERIZATION     stent-2005   COLONOSCOPY  01/11/2006   KIDNEY STONE SURGERY  01/12/1988   Social History   Occupational History   Not on file  Tobacco Use   Smoking status: Former    Packs/day: 1.50    Years: 20.00    Pack years: 30.00    Types: Cigarettes    Quit date: 01/18/2003    Years since quitting: 18.0   Smokeless tobacco: Never  Vaping Use   Vaping Use: Never used  Substance and Sexual Activity   Alcohol use: No   Drug use: No   Sexual activity: Yes

## 2021-02-05 NOTE — Progress Notes (Signed)
[facility] Posting Form   Surgery Date/Time: Date: 02/06/2021  Surgeon: Dr. Michaelle Birks, MD  Surgery Location: Day Surgery  Inpt ( No  )   Outpt (Yes)   Obs ( No  )   Diagnosis: N20.1 Right Ureteral Joaquim Lai  -CPTRN:[id number]  Surgery: Right Ureteroscopy with Laser Lithotripsy and Stent Placement with Right Retrograde pyelogram  Stop Anticoagulations: Yes  Cardiac/Medical/Pulmonary Clearance needed: no  *Orders entered into EPIC  Date: 02/05/21   *Case booked in EPIC  Date: 02/05/21  *Notified pt of Surgery: Date: 02/05/21  *Placed into Prior Authorization Work Fabio Bering Date: 02/05/21   Assistant/laser/rep:No

## 2021-02-06 ENCOUNTER — Ambulatory Visit (HOSPITAL_COMMUNITY)
Admission: RE | Admit: 2021-02-06 | Discharge: 2021-02-06 | Disposition: A | Discharge status: Home / Self Care | Payer: Medicare PPO | Attending: Urology | Admitting: Urology

## 2021-02-06 ENCOUNTER — Ambulatory Visit (HOSPITAL_COMMUNITY): Payer: Medicare PPO

## 2021-02-06 ENCOUNTER — Encounter (HOSPITAL_COMMUNITY): Payer: Self-pay | Admitting: Urology

## 2021-02-06 ENCOUNTER — Ambulatory Visit (HOSPITAL_COMMUNITY): Payer: Medicare PPO | Admitting: Certified Registered"

## 2021-02-06 ENCOUNTER — Other Ambulatory Visit: Payer: Self-pay | Admitting: Urology

## 2021-02-06 ENCOUNTER — Encounter (HOSPITAL_COMMUNITY)
Admission: RE | Disposition: A | Discharge status: Home / Self Care | Payer: Self-pay | Source: Home / Self Care | Attending: Urology

## 2021-02-06 DIAGNOSIS — I10 Essential (primary) hypertension: Secondary | ICD-10-CM | POA: Diagnosis not present

## 2021-02-06 DIAGNOSIS — Z87891 Personal history of nicotine dependence: Secondary | ICD-10-CM | POA: Insufficient documentation

## 2021-02-06 DIAGNOSIS — N201 Calculus of ureter: Secondary | ICD-10-CM

## 2021-02-06 DIAGNOSIS — Z955 Presence of coronary angioplasty implant and graft: Secondary | ICD-10-CM | POA: Diagnosis not present

## 2021-02-06 DIAGNOSIS — I2510 Atherosclerotic heart disease of native coronary artery without angina pectoris: Secondary | ICD-10-CM | POA: Diagnosis not present

## 2021-02-06 DIAGNOSIS — I252 Old myocardial infarction: Secondary | ICD-10-CM | POA: Diagnosis not present

## 2021-02-06 DIAGNOSIS — N132 Hydronephrosis with renal and ureteral calculous obstruction: Secondary | ICD-10-CM | POA: Diagnosis not present

## 2021-02-06 DIAGNOSIS — N289 Disorder of kidney and ureter, unspecified: Secondary | ICD-10-CM | POA: Insufficient documentation

## 2021-02-06 HISTORY — PX: CYSTOSCOPY W/ URETERAL STENT PLACEMENT: SHX1429

## 2021-02-06 LAB — POCT I-STAT, CHEM 8
BUN: 38 mg/dL — ABNORMAL HIGH (ref 8–23)
Calcium, Ion: 1 mmol/L — ABNORMAL LOW (ref 1.15–1.40)
Chloride: 112 mmol/L — ABNORMAL HIGH (ref 98–111)
Creatinine, Ser: 2.1 mg/dL — ABNORMAL HIGH (ref 0.61–1.24)
Glucose, Bld: 104 mg/dL — ABNORMAL HIGH (ref 70–99)
HCT: 41 % (ref 39.0–52.0)
Hemoglobin: 13.9 g/dL (ref 13.0–17.0)
Potassium: 5.4 mmol/L — ABNORMAL HIGH (ref 3.5–5.1)
Sodium: 138 mmol/L (ref 135–145)
TCO2: 21 mmol/L — ABNORMAL LOW (ref 22–32)

## 2021-02-06 SURGERY — CYSTOSCOPY, WITH RETROGRADE PYELOGRAM AND URETERAL STENT INSERTION
Anesthesia: General | Site: Ureter | Laterality: Right

## 2021-02-06 MED ORDER — HYDROCODONE-ACETAMINOPHEN 5-325 MG PO TABS: 1.0000 | ORAL_TABLET | Freq: Four times a day (QID) | ORAL | 0 refills | Status: DC | PRN

## 2021-02-06 MED ORDER — FENTANYL CITRATE (PF) 100 MCG/2ML IJ SOLN
INTRAMUSCULAR | Status: DC | PRN
  Administered 2021-02-06 (×2): 50 ug via INTRAVENOUS

## 2021-02-06 MED ORDER — SEVOFLURANE IN SOLN
RESPIRATORY_TRACT | Status: AC
  Filled 2021-02-06: qty 250

## 2021-02-06 MED ORDER — PHENAZOPYRIDINE HCL 200 MG PO TABS: 200.0000 mg | ORAL_TABLET | Freq: Three times a day (TID) | ORAL | 1 refills | Status: DC | PRN

## 2021-02-06 MED ORDER — SIMETHICONE 40 MG/0.6ML PO SUSP
ORAL | Status: AC
  Filled 2021-02-06: qty 0.6

## 2021-02-06 MED ORDER — CHLORHEXIDINE GLUCONATE 0.12 % MT SOLN: 15.0000 mL | Freq: Once | OROMUCOSAL | Status: DC

## 2021-02-06 MED ORDER — DEXAMETHASONE SODIUM PHOSPHATE 10 MG/ML IJ SOLN
INTRAMUSCULAR | Status: AC
  Filled 2021-02-06: qty 1

## 2021-02-06 MED ORDER — FENTANYL CITRATE (PF) 100 MCG/2ML IJ SOLN
INTRAMUSCULAR | Status: AC
  Filled 2021-02-06: qty 2

## 2021-02-06 MED ORDER — DEXAMETHASONE SODIUM PHOSPHATE 4 MG/ML IJ SOLN
INTRAMUSCULAR | Status: DC | PRN
  Administered 2021-02-06: 5 mg via INTRAVENOUS

## 2021-02-06 MED ORDER — LACTATED RINGERS IV SOLN: INTRAVENOUS | Status: DC

## 2021-02-06 MED ORDER — LIDOCAINE HCL URETHRAL/MUCOSAL 2 % EX GEL
CUTANEOUS | Status: DC | PRN
  Administered 2021-02-06: 1 via URETHRAL

## 2021-02-06 MED ORDER — ONDANSETRON HCL 4 MG/2ML IJ SOLN
INTRAMUSCULAR | Status: DC | PRN
  Administered 2021-02-06: 4 mg via INTRAVENOUS

## 2021-02-06 MED ORDER — PHENYLEPHRINE 40 MCG/ML (10ML) SYRINGE FOR IV PUSH (FOR BLOOD PRESSURE SUPPORT)
PREFILLED_SYRINGE | INTRAVENOUS | Status: AC
  Filled 2021-02-06: qty 10

## 2021-02-06 MED ORDER — LIDOCAINE HCL (PF) 2 % IJ SOLN
INTRAMUSCULAR | Status: AC
  Filled 2021-02-06: qty 5

## 2021-02-06 MED ORDER — STERILE WATER FOR IRRIGATION IR SOLN
Status: DC | PRN
  Administered 2021-02-06: 500 mL

## 2021-02-06 MED ORDER — PROPOFOL 10 MG/ML IV BOLUS
INTRAVENOUS | Status: DC | PRN
  Administered 2021-02-06: 150 mg via INTRAVENOUS

## 2021-02-06 MED ORDER — ORAL CARE MOUTH RINSE: 15.0000 mL | Freq: Once | OROMUCOSAL | Status: DC

## 2021-02-06 MED ORDER — MIDAZOLAM HCL 2 MG/2ML IJ SOLN
INTRAMUSCULAR | Status: AC
  Filled 2021-02-06: qty 2

## 2021-02-06 MED ORDER — EPHEDRINE SULFATE-NACL 50-0.9 MG/10ML-% IV SOSY
PREFILLED_SYRINGE | INTRAVENOUS | Status: DC | PRN
  Administered 2021-02-06 (×2): 5 mg via INTRAVENOUS

## 2021-02-06 MED ORDER — GLYCOPYRROLATE PF 0.2 MG/ML IJ SOSY
PREFILLED_SYRINGE | INTRAMUSCULAR | Status: DC | PRN
  Administered 2021-02-06 (×2): .1 mg via INTRAVENOUS

## 2021-02-06 MED ORDER — DIATRIZOATE MEGLUMINE 30 % UR SOLN
URETHRAL | Status: AC
  Filled 2021-02-06: qty 100

## 2021-02-06 MED ORDER — DIATRIZOATE MEGLUMINE 30 % UR SOLN
URETHRAL | Status: DC | PRN
  Administered 2021-02-06: 24 mL via URETHRAL

## 2021-02-06 MED ORDER — CEFDINIR 300 MG PO CAPS: 300.0000 mg | ORAL_CAPSULE | Freq: Two times a day (BID) | ORAL | 0 refills | Status: AC

## 2021-02-06 MED ORDER — EPHEDRINE 5 MG/ML INJ
INTRAVENOUS | Status: AC
  Filled 2021-02-06: qty 5

## 2021-02-06 MED ORDER — PROPOFOL 10 MG/ML IV BOLUS
INTRAVENOUS | Status: AC
  Filled 2021-02-06: qty 20

## 2021-02-06 MED ORDER — ONDANSETRON HCL 4 MG/2ML IJ SOLN
INTRAMUSCULAR | Status: AC
  Filled 2021-02-06: qty 2

## 2021-02-06 MED ORDER — GLYCOPYRROLATE PF 0.2 MG/ML IJ SOSY
PREFILLED_SYRINGE | INTRAMUSCULAR | Status: AC
  Filled 2021-02-06: qty 1

## 2021-02-06 MED ORDER — MIDAZOLAM HCL 5 MG/5ML IJ SOLN
INTRAMUSCULAR | Status: DC | PRN
  Administered 2021-02-06: 1 mg via INTRAVENOUS

## 2021-02-06 MED ORDER — TAMSULOSIN HCL 0.4 MG PO CAPS: 0.4000 mg | ORAL_CAPSULE | Freq: Every day | ORAL | 1 refills | Status: DC

## 2021-02-06 MED ORDER — CEFAZOLIN SODIUM-DEXTROSE 2-4 GM/100ML-% IV SOLN
2.0000 g | INTRAVENOUS | Status: AC
  Administered 2021-02-06: 2 g via INTRAVENOUS
  Filled 2021-02-06: qty 100

## 2021-02-06 MED ORDER — LIDOCAINE 2% (20 MG/ML) 5 ML SYRINGE
INTRAMUSCULAR | Status: DC | PRN
  Administered 2021-02-06: 100 mg via INTRAVENOUS

## 2021-02-06 MED ORDER — PHENYLEPHRINE 40 MCG/ML (10ML) SYRINGE FOR IV PUSH (FOR BLOOD PRESSURE SUPPORT)
PREFILLED_SYRINGE | INTRAVENOUS | Status: DC | PRN
  Administered 2021-02-06 (×6): 80 ug via INTRAVENOUS
  Administered 2021-02-06: 120 ug via INTRAVENOUS

## 2021-02-06 MED ORDER — SODIUM CHLORIDE 0.9 % IR SOLN
Status: DC | PRN
  Administered 2021-02-06 (×2): 3000 mL

## 2021-02-06 SURGICAL SUPPLY — 24 items
BAG DRAIN URO TABLE W/ADPT NS (BAG) ×3 IMPLANT
BAG DRN 8 ADPR NS SKTRN CSTL (BAG) ×2
BAG HAMPER (MISCELLANEOUS) ×3 IMPLANT
CATH URET 5FR 28IN OPEN ENDED (CATHETERS) ×3 IMPLANT
CATH URET FLEX-TIP 2 LUMEN 10F (CATHETERS) ×2 IMPLANT
CLOTH BEACON ORANGE TIMEOUT ST (SAFETY) ×3 IMPLANT
GLOVE SURG POLYISO LF SZ8 (GLOVE) ×3 IMPLANT
GLOVE SURG UNDER POLY LF SZ7 (GLOVE) ×6 IMPLANT
GOWN STRL REUS W/TWL LRG LVL3 (GOWN DISPOSABLE) ×3 IMPLANT
GOWN STRL REUS W/TWL XL LVL3 (GOWN DISPOSABLE) ×3 IMPLANT
GUIDEWIRE STR ZIPWIRE 035X150 (MISCELLANEOUS) ×2 IMPLANT
IV NS IRRIG 3000ML ARTHROMATIC (IV SOLUTION) ×5 IMPLANT
KIT TURNOVER CYSTO (KITS) ×3 IMPLANT
MANIFOLD NEPTUNE II (INSTRUMENTS) ×3 IMPLANT
PACK CYSTO (CUSTOM PROCEDURE TRAY) ×3 IMPLANT
PAD ARMBOARD 7.5X6 YLW CONV (MISCELLANEOUS) ×3 IMPLANT
SHEATH URETERAL 12FRX35CM (MISCELLANEOUS) ×2 IMPLANT
SHEATH URETERAL FLEX 12X35 (SHEATH) ×2 IMPLANT
STENT URET 6FRX26 CONTOUR (STENTS) ×2 IMPLANT
SYR 10ML LL (SYRINGE) ×3 IMPLANT
SYR TOOMEY IRRIG 70ML (MISCELLANEOUS) ×3
SYRINGE TOOMEY IRRIG 70ML (MISCELLANEOUS) ×1 IMPLANT
TOWEL OR 17X26 4PK STRL BLUE (TOWEL DISPOSABLE) ×3 IMPLANT
WATER STERILE IRR 500ML POUR (IV SOLUTION) ×3 IMPLANT

## 2021-02-06 NOTE — Interval H&P Note (Signed)
History and Physical Interval Note:  02/06/2021 10:11 AM  Johnny Johns  has presented today for surgery, with the diagnosis of Right Ureteral Stone.  The various methods of treatment have been discussed with the patient and family. After consideration of risks, benefits and other options for treatment, the patient has consented to  Procedure(s): CYSTOSCOPY WITH RETROGRADE PYELOGRAM/URETERAL STENT PLACEMENT (Right) HOLMIUM LASER APPLICATION (Right) as a surgical intervention.  The patient's history has been reviewed, patient examined, no change in status, stable for surgery.  I have reviewed the patient's chart and labs.  Questions were answered to the patient's satisfaction.     Di Kindle

## 2021-02-06 NOTE — Anesthesia Preprocedure Evaluation (Signed)
Anesthesia Evaluation  Patient identified by MRN, date of birth, ID band Patient awake    Reviewed: Allergy & Precautions, H&P , NPO status , Patient's Chart, lab work & pertinent test results, reviewed documented beta blocker date and time   Airway Mallampati: II  TM Distance: >3 FB Neck ROM: full    Dental no notable dental hx.    Pulmonary neg pulmonary ROS, former smoker,    Pulmonary exam normal breath sounds clear to auscultation       Cardiovascular Exercise Tolerance: Good hypertension, + CAD, + Past MI and + Cardiac Stents   Rhythm:regular Rate:Normal     Neuro/Psych negative neurological ROS  negative psych ROS   GI/Hepatic Neg liver ROS, PUD,   Endo/Other  negative endocrine ROS  Renal/GU negative Renal ROS  negative genitourinary   Musculoskeletal   Abdominal   Peds  Hematology negative hematology ROS (+)   Anesthesia Other Findings   Reproductive/Obstetrics negative OB ROS                             Anesthesia Physical Anesthesia Plan  ASA: 3  Anesthesia Plan: General and General LMA   Post-op Pain Management:    Induction:   PONV Risk Score and Plan: Ondansetron  Airway Management Planned:   Additional Equipment:   Intra-op Plan:   Post-operative Plan:   Informed Consent: I have reviewed the patients History and Physical, chart, labs and discussed the procedure including the risks, benefits and alternatives for the proposed anesthesia with the patient or authorized representative who has indicated his/her understanding and acceptance.     Dental Advisory Given  Plan Discussed with: CRNA  Anesthesia Plan Comments:         Anesthesia Quick Evaluation

## 2021-02-06 NOTE — Anesthesia Procedure Notes (Signed)
Procedure Name: LMA Insertion Date/Time: 02/06/2021 10:51 AM Performed by: Julian Reil, CRNA Pre-anesthesia Checklist: Patient identified, Emergency Drugs available, Suction available and Patient being monitored Patient Re-evaluated:Patient Re-evaluated prior to induction Oxygen Delivery Method: Circle system utilized Preoxygenation: Pre-oxygenation with 100% oxygen Induction Type: IV induction LMA: LMA inserted LMA Size: 4.0 Tube type: Oral Number of attempts: 1 Placement Confirmation: positive ETCO2 Tube secured with: Tape Dental Injury: Teeth and Oropharynx as per pre-operative assessment

## 2021-02-06 NOTE — Progress Notes (Signed)
[facility] scheduled for -1/31 with Dr. Pete Glatter  Patient will come to office on 01/30 to have litho packet reviewed.   Case booked: 02/06/2021 Orders entered : 02/06/2021 Follow up scheduled with PA 2 weeks post with [facility]  Patient will hold ASA for procedure.  Insurance Precert : not needed

## 2021-02-06 NOTE — Anesthesia Postprocedure Evaluation (Signed)
Anesthesia Post Note  Patient: Johnny Johns  Procedure(s) Performed: CYSTOSCOPY WITH RIGHT RETROGRADE PYELOGRAM/ RIGHT URETERAL STENT PLACEMENT/ RIGHT URETEROSCOPY (Right: Ureter)  Patient location during evaluation: Phase II Anesthesia Type: General Level of consciousness: awake Pain management: pain level controlled Vital Signs Assessment: post-procedure vital signs reviewed and stable Respiratory status: spontaneous breathing and respiratory function stable Cardiovascular status: blood pressure returned to baseline and stable Postop Assessment: no headache and no apparent nausea or vomiting Anesthetic complications: no Comments: Late entry   No notable events documented.   Last Vitals:  Vitals:   02/06/21 1200 02/06/21 1231  BP: (!) 130/91 130/88  Pulse: 77 74  Resp: 12 16  Temp:  (!) 36.4 C  SpO2: 100% 96%    Last Pain:  Vitals:   02/06/21 1231  TempSrc: Oral  PainSc: 0-No pain                 Windell Norfolk

## 2021-02-06 NOTE — Op Note (Signed)
OPERATIVE NOTE   Patient Name: Johnny Johns   Date of Procedure: 02/06/21   Preoperative diagnosis:  Right ureteral calculus  Postoperative diagnosis:  Right ureteral calculus  Procedure:  Cystoscopy Right retrograde pyelogram Right ureteroscopy Insertion of right ureteral stent (65F x 26 cm, no tether)  Attending: Primus Bravo, MD  Anesthesia: General  Estimated blood loss: 10 ml  Fluids: Per anesthesia record  Drains: 65F x 26 cm right ureteral stent  Specimens: None  Antibiotics: Ancef 2 gm IV  Findings: Normal urethra, bilateral prostate enlargement with bladder neck elevation, normal bladder, 7 mm proximal right ureteral calculus with obstruction  Indications:  71 year old male with recently diagnosed proximal right ureteral calculus presents for surgical management.  He has a 10-day history of some intermittent gross painless hematuria.  CT imaging demonstrated a 7-8 mm right proximal ureteral stone with associated right hydronephrosis.  His creatinine was elevated to 2.2.  Treatment options were discussed with the patient.  Given his elevated creatinine, surgical management was recommended.  He presents now for cystoscopy, right retrograde pyelogram, right ureteroscopy, possible laser lithotripsy, and insertion of right ureteral stent.   Risks, benefits, alternatives were discussed with the patient in detail.  Potential risks including, but not limited to, infection; bleeding;  injury to urethra, bladder, or ureter; possible need of other treatments; possible failure to remove the calculus; ureteral  stricture formation; cardiac, pulmonary, cerebrovascular events; and anesthetic complications were discussed.  The patient understands and wishes to proceed.   Description of Procedure:  The patient received IV Ancef preoperatively.  After successful induction of a general anesthetic, the patient was placed in the dorsolithotomy position.  The patient's genital  area was prepped and draped in sterile fashion.  Under direct visualization, a 67 French rigid cystoscope was passed through the urethra and into the bladder.  The patient was noted to have by lobar enlargement of the prostate and elevation of the bladder neck.  Upon entering the bladder, the bladder was inspected throughout its entirety.  A normal-appearing trigone was seen.  No mucosal lesions were seen throughout the bladder.  A right retrograde pyelogram was performed for evaluation of the patient's right collecting system given his history of a ureteral stone and gross hematuria.  Scout film demonstrated a 8 mm calcification in the area of the right proximal ureter consistent with the stone seen on CT imaging.  Scout film also demonstrated a nonspecific bowel gas pattern and no obvious bony abnormalities.  Using an open-ended catheter, contrast was injected into the right ureter.  The right distal and mid ureter were normal in appearance.  The previously noted calcification was confirmed to be within the right proximal ureter.  There was dilation of the ureter proximal to this stone extending to the renal pelvis and calyces.  A sensor guidewire was passed through the open-ended catheter and with gentle manipulation was passed into the right renal pelvis beyond the obstructing stone.  The open-ended catheter was passed beyond the stone and into the renal pelvis.  Contrast was injected to confirm location.  There was also the return of dark urine from the catheter.  The distal ureter was dilated using a 10/12 access sheath.  Dilation was able to be performed up to the level of the vessels.  A Flexor parallel access sheath was placed up to this level leaving the guidewire in place.  Flexible ureteroscopy was performed through the sheath and alongside the guidewire.  I was unable to easily negotiate  the flexible ureteroscope beyond the ureter approximately 1-2 cm distal to the calculus.  There appeared to be  some narrowing of the ureter in this area.  I passed a 10 French dual-lumen catheter over the guidewire up to this same area.  The catheter would not pass easily further approximately.  A second guidewire was placed.  I then attempted flexible ureteroscopy over the second guidewire.  The distal and mid ureter were normal in appearance and without any evidence of injury.  Unfortunately, I was not able to pass the ureteroscope to a point where the stone was visualized.  Given the difficulty with dilation I elected to discontinue further attempts at ureteroscopy to avoid any ureteral injury.  A 6 French by 26 cm double-J stent was passed over the guidewire and into the right renal pelvis.  Position was confirmed with fluoroscopy.  The tether was removed prior to stent placement.  A good proximal and distal curl were noted.  The bladder was then drained.  Several small clots were removed.  The cystoscope was removed.  Intraurethral lidocaine jelly was placed.  The patient was then extubated and taken to the post anesthesia care unit in stable condition.  Complications: None  Condition: Stable, extubated, transferred to PACU  Plan:  Continue right ureteral stent. Schedule for right shockwave lithotripsy for management of the proximal ureteral calculus next week. Discharge to home.

## 2021-02-06 NOTE — Transfer of Care (Signed)
Immediate Anesthesia Transfer of Care Note  Patient: Johnny Johns  Procedure(s) Performed: CYSTOSCOPY WITH RIGHT RETROGRADE PYELOGRAM/ RIGHT URETERAL STENT PLACEMENT/ RIGHT URETEROSCOPY (Right: Ureter)  Patient Location: PACU  Anesthesia Type:General  Level of Consciousness: awake  Airway & Oxygen Therapy: Patient Spontanous Breathing and Patient connected to face mask oxygen  Post-op Assessment: Report given to RN and Post -op Vital signs reviewed and stable  Post vital signs: Reviewed and stable  Last Vitals:  Vitals Value Taken Time  BP 121/72 02/06/21 1154  Temp    Pulse 85 02/06/21 1156  Resp 13 02/06/21 1156  SpO2 100 % 02/06/21 1156  Vitals shown include unvalidated device data.  Last Pain:  Vitals:   02/06/21 0913  TempSrc: Oral  PainSc: 0-No pain         Complications: No notable events documented.

## 2021-02-06 NOTE — Progress Notes (Signed)
Surgical Physician Order [facility]  * Scheduling expectation :  02/10/21  *Length of Case: 60 minutes  *MD Preforming Case: Di Kindle, MD  *Assistant Needed: no  *Facility Preference: [facility]  *Clearance needed: no  *Anticoagulation Instructions: N/A  *Aspirin Instructions: Hold Aspirin  -Admit type: OUTpatient  -Anesthesia: Local  -Use Standing Orders: ESWL  *Diagnosis: Right Ureteral Stone  *Procedure: right  ESL  Additional orders: N/A  -Equipment:  None -VTE Prophylaxis Standing Order SCDs       Other:   -Standing Lab Orders Per Anesthesia    Lab other: None  -Standing Test orders EKG/Chest x-ray per Anesthesia       Test other:   - Medications:   None  -Other orders:  N/A  *Post-op visit Date/Instructions:  1-2 week with KUB prior

## 2021-02-07 ENCOUNTER — Other Ambulatory Visit: Payer: Self-pay

## 2021-02-07 ENCOUNTER — Encounter (HOSPITAL_COMMUNITY): Payer: Self-pay

## 2021-02-07 ENCOUNTER — Emergency Department (HOSPITAL_COMMUNITY)
Admission: EM | Admit: 2021-02-07 | Discharge: 2021-02-07 | Disposition: A | Discharge status: Home / Self Care | Payer: Medicare PPO | Attending: Emergency Medicine | Admitting: Emergency Medicine

## 2021-02-07 DIAGNOSIS — R339 Retention of urine, unspecified: Secondary | ICD-10-CM

## 2021-02-07 DIAGNOSIS — R10819 Abdominal tenderness, unspecified site: Secondary | ICD-10-CM | POA: Insufficient documentation

## 2021-02-07 LAB — URINALYSIS, ROUTINE W REFLEX MICROSCOPIC
Glucose, UA: 100 mg/dL — AB
Ketones, ur: NEGATIVE mg/dL
Nitrite: POSITIVE — AB
Protein, ur: >300 mg/dL — AB
Specific Gravity, Urine: 1.025 (ref 1.005–1.030)
pH: 6.5 (ref 5.0–8.0)

## 2021-02-07 LAB — BASIC METABOLIC PANEL
Anion gap: 9 (ref 5–15)
BUN: 33 mg/dL — ABNORMAL HIGH (ref 8–23)
CO2: 21 mmol/L — ABNORMAL LOW (ref 22–32)
Calcium: 9 mg/dL (ref 8.9–10.3)
Chloride: 103 mmol/L (ref 98–111)
Creatinine, Ser: 2.06 mg/dL — ABNORMAL HIGH (ref 0.61–1.24)
GFR, Estimated: 34 mL/min — ABNORMAL LOW (ref >60)
Glucose, Bld: 147 mg/dL — ABNORMAL HIGH (ref 70–99)
Potassium: 4.9 mmol/L (ref 3.5–5.1)
Sodium: 133 mmol/L — ABNORMAL LOW (ref 135–145)

## 2021-02-07 LAB — URINALYSIS, MICROSCOPIC (REFLEX): RBC / HPF: >50 RBC/hpf (ref 0–5)

## 2021-02-07 LAB — CBC
HCT: 39.4 % (ref 39.0–52.0)
Hemoglobin: 12.6 g/dL — ABNORMAL LOW (ref 13.0–17.0)
MCH: 29.5 pg (ref 26.0–34.0)
MCHC: 32 g/dL (ref 30.0–36.0)
MCV: 92.3 fL (ref 80.0–100.0)
Platelets: 330 10*3/uL (ref 150–400)
RBC: 4.27 MIL/uL (ref 4.22–5.81)
RDW: 13.1 % (ref 11.5–15.5)
WBC: 13.6 10*3/uL — ABNORMAL HIGH (ref 4.0–10.5)
nRBC: 0 % (ref 0.0–0.2)

## 2021-02-07 NOTE — ED Provider Notes (Signed)
[facility] EMERGENCY DEPARTMENT Provider Note   CSN: [id number] Arrival date & time: 02/07/21  0209     History  Chief Complaint  Patient presents with   Urinary Retention    Johnny Johns is a 71 y.o. male.  Patient presents to the emergency department for evaluation of urinary retention with bladder discomfort.  Patient reports that he had cystoscopy with right ureteral stent placement today.  He has not been able to pass any urine since 7 PM.      Home Medications Prior to Admission medications   Medication Sig Start Date End Date Taking? Authorizing Provider  cefdinir (OMNICEF) 300 MG capsule Take 1 capsule (300 mg total) by mouth 2 (two) times daily for 7 days. 02/06/21 02/13/21  Stoneking, Danford Bad., MD  HYDROcodone-acetaminophen (NORCO/VICODIN) 5-325 MG tablet Take 1 tablet by mouth every 6 (six) hours as needed for severe pain. 02/06/21   Stoneking, Danford Bad., MD  metaxalone (SKELAXIN) 800 MG tablet Take 800 mg by mouth 2 (two) times daily as needed. 01/19/21   [provider]  phenazopyridine (PYRIDIUM) 200 MG tablet Take 1 tablet (200 mg total) by mouth 3 (three) times daily as needed for pain. 02/06/21 02/06/22  Stoneking, Danford Bad., MD  tamsulosin (FLOMAX) 0.4 MG CAPS capsule Take 1 capsule (0.4 mg total) by mouth daily. 02/06/21   Stoneking, Danford Bad., MD  valsartan (DIOVAN) 320 MG tablet Take 320 mg by mouth at bedtime. 12/15/20   [provider]      Allergies    Gemfibrozil, Metoprolol, Simvastatin, Statins, and Vioxx [rofecoxib]    Review of Systems   Review of Systems  Genitourinary:  Positive for decreased urine volume.   Physical Exam Updated Vital Signs BP (!) 152/81    Pulse 81    Temp 97.8 F (36.6 C) (Oral)    Resp 18    Ht 5\' 10"  (1.778 m)    Wt 108 kg    SpO2 96%    BMI 34.15 kg/m  Physical Exam Vitals and nursing note reviewed.  Constitutional:      General: He is not in acute distress.    Appearance: He is well-developed.   HENT:     Head: Normocephalic and atraumatic.  Eyes:     Conjunctiva/sclera: Conjunctivae normal.  Cardiovascular:     Rate and Rhythm: Normal rate and regular rhythm.     Heart sounds: No murmur heard. Pulmonary:     Effort: Pulmonary effort is normal. No respiratory distress.     Breath sounds: Normal breath sounds.  Abdominal:     Palpations: Abdomen is soft.     Tenderness: There is abdominal tenderness in the suprapubic area.  Musculoskeletal:        General: No swelling.     Cervical back: Neck supple.  Skin:    General: Skin is warm and dry.     Capillary Refill: Capillary refill takes less than 2 seconds.  Neurological:     Mental Status: He is alert.  Psychiatric:        Mood and Affect: Mood normal.    ED Results / Procedures / Treatments   Labs (all labs ordered are listed, but only abnormal results are displayed) Labs Reviewed  URINALYSIS, ROUTINE W REFLEX MICROSCOPIC - Abnormal; Notable for the following components:      Result Value   Glucose, UA 100 (*)    Hgb urine dipstick LARGE (*)    Bilirubin Urine SMALL (*)  Protein, ur >300 (*)    Nitrite POSITIVE (*)    Leukocytes,Ua SMALL (*)    All other components within normal limits  BASIC METABOLIC PANEL - Abnormal; Notable for the following components:   Sodium 133 (*)    CO2 21 (*)    Glucose, Bld 147 (*)    BUN 33 (*)    Creatinine, Ser 2.06 (*)    GFR, Estimated 34 (*)    All other components within normal limits  CBC - Abnormal; Notable for the following components:   WBC 13.6 (*)    Hemoglobin 12.6 (*)    All other components within normal limits  URINALYSIS, MICROSCOPIC (REFLEX) - Abnormal; Notable for the following components:   Bacteria, UA MANY (*)    All other components within normal limits  URINE CULTURE    EKG None  Radiology DG C-Arm 1-60 Min-No Report  Result Date: 02/06/2021 Fluoroscopy was utilized by the requesting physician.  No radiographic interpretation.     Procedures Procedures    Medications Ordered in ED Medications - No data to display  ED Course/ Medical Decision Making/ A&P                           Medical Decision Making Amount and/or Complexity of Data Reviewed Labs: ordered.   Patient presents to the emergency department for evaluation of urinary retention.  Patient underwent cystoscopy with right ureteral stent placement for a proximal ureteral stone earlier today.  Patient noted to have elevated creatinine preoperatively.  This was rechecked and is stable.  A bladder scan at arrival revealed greater than 700 mL in his bladder, patient had a Foley catheter placed to decompress his bladder.  No signs of infection on urinalysis.  Will discharge with catheter in place, follow-up with urology.         Final Clinical Impression(s) / ED Diagnoses Final diagnoses:  Urinary retention    Rx / DC Orders ED Discharge Orders     None         Skarlett Sedlacek, Canary Brim, MD 02/07/21 [phone]

## 2021-02-07 NOTE — ED Triage Notes (Signed)
Pt reports cystoscopy yesterday, per pt, unable to reach kidney stone, stent was placed and lithotripsy scheduled for Tuesday. Pt has not been able to urinate since 7 pm yesterday. Bladder scanner in triage reveals 782 ml urine in bladder.

## 2021-02-09 ENCOUNTER — Other Ambulatory Visit: Payer: Self-pay

## 2021-02-09 ENCOUNTER — Ambulatory Visit (INDEPENDENT_AMBULATORY_CARE_PROVIDER_SITE_OTHER): Payer: Medicare PPO | Admitting: Physician Assistant

## 2021-02-09 ENCOUNTER — Encounter (HOSPITAL_COMMUNITY): Payer: Self-pay | Admitting: Urology

## 2021-02-09 ENCOUNTER — Encounter (HOSPITAL_COMMUNITY)
Admission: RE | Admit: 2021-02-09 | Discharge: 2021-02-09 | Disposition: A | Discharge status: Home / Self Care | Payer: Medicare PPO | Source: Ambulatory Visit | Attending: Urology | Admitting: Urology

## 2021-02-09 VITALS — BP 107/76 | HR 89 | Temp 98.4°F

## 2021-02-09 DIAGNOSIS — N200 Calculus of kidney: Secondary | ICD-10-CM | POA: Insufficient documentation

## 2021-02-09 DIAGNOSIS — R339 Retention of urine, unspecified: Secondary | ICD-10-CM

## 2021-02-09 DIAGNOSIS — N201 Calculus of ureter: Secondary | ICD-10-CM | POA: Insufficient documentation

## 2021-02-09 DIAGNOSIS — N179 Acute kidney failure, unspecified: Secondary | ICD-10-CM | POA: Insufficient documentation

## 2021-02-09 LAB — URINE CULTURE: Culture: NO GROWTH

## 2021-02-09 NOTE — Progress Notes (Signed)
Patient came today to discuss lithotripsy packet for surgery tomorrow. Patient went to ER this past Friday post op and required foley catheter placement for retention.. Patient will see PA to discuss.

## 2021-02-09 NOTE — Progress Notes (Signed)
Assessment: 1. Urinary retention   2. Right ureteral stone   3. Kidney stones     Plan: The patient is not a full 3 days out from Foley placement and maintaining the Foley is recommended until after lithotripsy.  Discussed voiding trial within the next week.  He will continue tamsulosin as previously prescribed and will proceed with outpatient ESWL tomorrow with Dr. Felipa Eth.  Chief Complaint: Urinary retention  HPI: Johnny Johns is a 71 y.o. male who presents for follow-up of acute urinary retention and continued evaluation of right-sided ureteral stone.  The patient underwent right ureteral stent placement on 02/06/2021 and presented to the emergency department on 128 and acute retention.  Foley catheter was placed and he was referred here for further evaluation prior to outpatient lithotripsies scheduled for tomorrow.  Currently, he denies pain, gross hematuria, nausea or vomiting.  He has had low-grade temps since stent placement, but none in the past 24 hours.  He is tolerating the catheter well.  Patient has no history of retention in the past and is taking daily tamsulosin since diagnosis of ureteral stone.  Medical records and recent imaging personally reviewed and discussed with the patient.  1/24 Creatinine 1.97 1/28 Creatinine 2.06  02/06/21 Johnny Johns  has presented today for surgery, with the diagnosis of Right Ureteral Stone.  The various methods of treatment have been discussed with the patient and family. After consideration of risks, benefits and other options for treatment, the patient has consented to  Procedure(s): CYSTOSCOPY WITH RETROGRADE PYELOGRAM/URETERAL STENT PLACEMENT (Right) HOLMIUM LASER APPLICATION (Right) as a surgical intervention.  The patient's history has been reviewed, patient examined, no change in status, stable for surgery.  I have reviewed the patient's chart and labs.  Questions were answered to the patient's satisfaction.     Portions of the  above documentation were copied from a prior visit for review purposes only.  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   Presented with acute myocardial infarction In 01/2003; isolated 90% proximal LAD lesion treated with DES; EF 45-50%   Degenerative joint disease    Chronic neck and back pain   Hyperlipidemia    lipid profile in 06/2010:195, 207, 37, 117   Hypertension    IBS (irritable bowel syndrome)    Myocardial infarction (HCC)    PUD (peptic ulcer disease)    hx   Tobacco abuse, in remission     PSH: Past Surgical History:  Procedure Laterality Date   CARDIAC CATHETERIZATION     stent-2005   COLONOSCOPY  01/11/2006   KIDNEY STONE SURGERY  01/12/1988    SH: Social History   Tobacco Use   Smoking status: Former    Packs/day: 1.50    Years: 20.00    Pack years: 30.00    Types: Cigarettes    Quit date: 01/18/2003    Years since quitting: 18.0   Smokeless tobacco: Never  Vaping Use   Vaping Use: Never used  Substance Use Topics   Alcohol use: No   Drug use: No    ROS: Constitutional:  Negative for fever, chills, weight loss  CV: Negative for chest pain, previous MI, hypertension Respiratory:  Negative for shortness of breath, wheezing, sleep apnea, frequent cough GI:  Negative for nausea, vomiting, bloody stool, GERD  PE: BP 107/76    Pulse 89    Temp 98.4 F (36.9 C)  GENERAL APPEARANCE:  Well appearing, well developed, well nourished, NAD HEENT:  Atraumatic, normocephalic NECK:  Supple. Trachea midline ABDOMEN:  Soft, non-tender, no masses EXTREMITIES:  Moves all extremities well, without clubbing, cyanosis, or  edema NEUROLOGIC:  Alert and oriented x 3, normal gait, CN II-XII grossly intact MENTAL STATUS:  appropriate BACK:  Non-tender to palpation, No CVAT SKIN:  Warm, dry, and intact   Results: Laboratory Data: Lab Results  Component Value Date   WBC 13.6 (H) 02/07/2021   HGB 12.6 (L) 02/07/2021   HCT 39.4 02/07/2021   MCV 92.3 02/07/2021   PLT 330 02/07/2021    Lab Results  Component Value Date   CREATININE 2.06 (H) 02/07/2021     Urinalysis    Component Value Date/Time   COLORURINE YELLOW 02/07/2021 0315   APPEARANCEUR CLEAR 02/07/2021 0315   APPEARANCEUR Clear 02/03/2021 1010   LABSPEC 1.025 02/07/2021 0315   PHURINE 6.5 02/07/2021 0315   GLUCOSEU 100 (A) 02/07/2021 0315   HGBUR LARGE (A) 02/07/2021 0315   BILIRUBINUR SMALL (A) 02/07/2021 0315   BILIRUBINUR Negative 02/03/2021 1010   KETONESUR NEGATIVE 02/07/2021 0315   PROTEINUR >300 (A) 02/07/2021 0315   NITRITE POSITIVE (A) 02/07/2021 0315   LEUKOCYTESUR SMALL (A) 02/07/2021 0315    Lab Results  Component Value Date   LABMICR See below: 02/03/2021   WBCUA None seen 02/03/2021   LABEPIT 0-10 02/03/2021   MUCUS Present 02/03/2021   BACTERIA MANY (A) 02/07/2021    Pertinent Imaging:  No results found for this or any previous visit.  No results found for this or any previous visit.  No results found for this or any previous visit.  No results found for this or any previous visit.  No results found for this or any previous visit.  No results found for this or any previous visit.  Results for orders placed during the hospital encounter of 02/02/21  CT HEMATURIA WORKUP  Narrative CLINICAL DATA:  Hematuria and flank pain.  History of renal stones.  EXAM: CT ABDOMEN AND PELVIS WITHOUT AND WITH CONTRAST  TECHNIQUE: Multidetector CT imaging of the abdomen and pelvis was performed following the standard protocol before and following the bolus administration of intravenous contrast.  RADIATION DOSE  REDUCTION: This exam was performed according to the departmental dose-optimization program which includes automated exposure control, adjustment of the mA and/or kV according to patient size and/or use of iterative reconstruction technique.  CONTRAST:  70mL OMNIPAQUE IOHEXOL 300 MG/ML  SOLN  COMPARISON:  None.  FINDINGS: Lower chest: Mild diffuse bronchial wall thickening in the lung bases. Patchy atelectasis for scarring. No acute abnormality.  Hepatobiliary: No suspicious hepatic lesion. Cholelithiasis without findings of acute cholecystitis. No biliary ductal dilation.  Pancreas: No pancreatic ductal dilation or evidence of acute inflammation.  Spleen: Normal in size without focal abnormality.  Adrenals/Urinary Tract: Bilateral adrenal glands are unremarkable.  Edematous appearance of the right kidney with hydroureteronephrosis to the level of a 8 x 4 mm stone in the proximal ureter on image 57/8 and 51/6, additionally the kidney demonstrates relatively delayed enhancement and excretion of contrast material.  No solid enhancing renal mass. Left kidney is unremarkable without hydronephrosis or nephrolithiasis.  Evaluation of the urinary  bladder is limited by urine contamination within this context there is no abnormal wall thickening or suspicious intraluminal filling defect identified.  Stomach/Bowel: No radiopaque enteric contrast was administered. Stomach is predominantly decompressed limiting evaluation. Small hiatal hernia. No pathologic dilation of large or small bowel. The appendix and terminal ileum appear normal. Extensive colonic diverticulosis without findings of acute diverticulitis.  Vascular/Lymphatic: Aortic and branch vessel atherosclerosis without abdominal aortic aneurysm. No pathologically enlarged abdominal or pelvic lymph nodes.  Reproductive: Mild enlargement of the prostate gland.  Other: No significant abdominopelvic free fluid.  No pneumoperitoneum.  Musculoskeletal: L5-S1 discogenic disease. No acute osseous abnormality.  IMPRESSION: 1. Obstructing 8 x 4 mm stone in the proximal right ureter with associated hydroureteronephrosis, consider correlation with laboratory values to exclude superimposed infection. 2. Cholelithiasis without findings of acute cholecystitis. 3. Extensive colonic diverticulosis without findings of acute diverticulitis. 4. Small hiatal hernia. 5.  Aortic Atherosclerosis (ICD10-I70.0).  These results will be called to the ordering clinician or representative by the Radiologist Assistant, and communication documented in the PACS or [employer].   Electronically Signed By: Dahlia Bailiff M.D. On: 02/02/2021 14:53  No results found for this or any previous visit.  No results found for this or any previous visit (from the past 24 hour(s)).

## 2021-02-09 NOTE — H&P (View-Only) (Signed)
Assessment: 1. Urinary retention   2. Right ureteral stone   3. Kidney stones     Plan: The patient is not a full 3 days out from Foley placement and maintaining the Foley is recommended until after lithotripsy.  Discussed voiding trial within the next week.  He will continue tamsulosin as previously prescribed and will proceed with outpatient ESWL tomorrow with Dr. Felipa Eth.  Chief Complaint: Urinary retention  HPI: Johnny Johns is a 71 y.o. male who presents for follow-up of acute urinary retention and continued evaluation of right-sided ureteral stone.  The patient underwent right ureteral stent placement on 02/06/2021 and presented to the emergency department on 128 and acute retention.  Foley catheter was placed and he was referred here for further evaluation prior to outpatient lithotripsies scheduled for tomorrow.  Currently, he denies pain, gross hematuria, nausea or vomiting.  He has had low-grade temps since stent placement, but none in the past 24 hours.  He is tolerating the catheter well.  Patient has no history of retention in the past and is taking daily tamsulosin since diagnosis of ureteral stone.  Medical records and recent imaging personally reviewed and discussed with the patient.  1/24 Creatinine 1.97 1/28 Creatinine 2.06  02/06/21 Johnny Johns  has presented today for surgery, with the diagnosis of Right Ureteral Stone.  The various methods of treatment have been discussed with the patient and family. After consideration of risks, benefits and other options for treatment, the patient has consented to  Procedure(s): CYSTOSCOPY WITH RETROGRADE PYELOGRAM/URETERAL STENT PLACEMENT (Right) HOLMIUM LASER APPLICATION (Right) as a surgical intervention.  The patient's history has been reviewed, patient examined, no change in status, stable for surgery.  I have reviewed the patient's chart and labs.  Questions were answered to the patient's satisfaction.     Portions of the  above documentation were copied from a prior visit for review purposes only.  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   Presented with acute myocardial infarction In 01/2003; isolated 90% proximal LAD lesion treated with DES; EF 45-50%   Degenerative joint disease    Chronic neck and back pain   Hyperlipidemia    lipid profile in 06/2010:195, 207, 37, 117   Hypertension    IBS (irritable bowel syndrome)    Myocardial infarction (HCC)    PUD (peptic ulcer disease)    hx   Tobacco abuse, in remission     PSH: Past Surgical History:  Procedure Laterality Date   CARDIAC CATHETERIZATION     stent-2005   COLONOSCOPY  01/11/2006   KIDNEY STONE SURGERY  01/12/1988    SH: Social History   Tobacco Use   Smoking status: Former    Packs/day: 1.50    Years: 20.00    Pack years: 30.00    Types: Cigarettes    Quit date: 01/18/2003    Years since quitting: 18.0   Smokeless tobacco: Never  Vaping Use   Vaping Use: Never used  Substance Use Topics   Alcohol use: No   Drug use: No    ROS: Constitutional:  Negative for fever, chills, weight loss  CV: Negative for chest pain, previous MI, hypertension Respiratory:  Negative for shortness of breath, wheezing, sleep apnea, frequent cough GI:  Negative for nausea, vomiting, bloody stool, GERD  PE: BP 107/76    Pulse 89    Temp 98.4 F (36.9 C)  GENERAL APPEARANCE:  Well appearing, well developed, well nourished, NAD HEENT:  Atraumatic, normocephalic NECK:  Supple. Trachea midline ABDOMEN:  Soft, non-tender, no masses EXTREMITIES:  Moves all extremities well, without clubbing, cyanosis, or  edema NEUROLOGIC:  Alert and oriented x 3, normal gait, CN II-XII grossly intact MENTAL STATUS:  appropriate BACK:  Non-tender to palpation, No CVAT SKIN:  Warm, dry, and intact   Results: Laboratory Data: Lab Results  Component Value Date   WBC 13.6 (H) 02/07/2021   HGB 12.6 (L) 02/07/2021   HCT 39.4 02/07/2021   MCV 92.3 02/07/2021   PLT 330 02/07/2021    Lab Results  Component Value Date   CREATININE 2.06 (H) 02/07/2021     Urinalysis    Component Value Date/Time   COLORURINE YELLOW 02/07/2021 0315   APPEARANCEUR CLEAR 02/07/2021 0315   APPEARANCEUR Clear 02/03/2021 1010   LABSPEC 1.025 02/07/2021 0315   PHURINE 6.5 02/07/2021 0315   GLUCOSEU 100 (A) 02/07/2021 0315   HGBUR LARGE (A) 02/07/2021 0315   BILIRUBINUR SMALL (A) 02/07/2021 0315   BILIRUBINUR Negative 02/03/2021 1010   KETONESUR NEGATIVE 02/07/2021 0315   PROTEINUR >300 (A) 02/07/2021 0315   NITRITE POSITIVE (A) 02/07/2021 0315   LEUKOCYTESUR SMALL (A) 02/07/2021 0315    Lab Results  Component Value Date   LABMICR See below: 02/03/2021   WBCUA None seen 02/03/2021   LABEPIT 0-10 02/03/2021   MUCUS Present 02/03/2021   BACTERIA MANY (A) 02/07/2021    Pertinent Imaging:  No results found for this or any previous visit.  No results found for this or any previous visit.  No results found for this or any previous visit.  No results found for this or any previous visit.  No results found for this or any previous visit.  No results found for this or any previous visit.  Results for orders placed during the hospital encounter of 02/02/21  CT HEMATURIA WORKUP  Narrative CLINICAL DATA:  Hematuria and flank pain.  History of renal stones.  EXAM: CT ABDOMEN AND PELVIS WITHOUT AND WITH CONTRAST  TECHNIQUE: Multidetector CT imaging of the abdomen and pelvis was performed following the standard protocol before and following the bolus administration of intravenous contrast.  RADIATION DOSE  REDUCTION: This exam was performed according to the departmental dose-optimization program which includes automated exposure control, adjustment of the mA and/or kV according to patient size and/or use of iterative reconstruction technique.  CONTRAST:  71mL OMNIPAQUE IOHEXOL 300 MG/ML  SOLN  COMPARISON:  None.  FINDINGS: Lower chest: Mild diffuse bronchial wall thickening in the lung bases. Patchy atelectasis for scarring. No acute abnormality.  Hepatobiliary: No suspicious hepatic lesion. Cholelithiasis without findings of acute cholecystitis. No biliary ductal dilation.  Pancreas: No pancreatic ductal dilation or evidence of acute inflammation.  Spleen: Normal in size without focal abnormality.  Adrenals/Urinary Tract: Bilateral adrenal glands are unremarkable.  Edematous appearance of the right kidney with hydroureteronephrosis to the level of a 8 x 4 mm stone in the proximal ureter on image 57/8 and 51/6, additionally the kidney demonstrates relatively delayed enhancement and excretion of contrast material.  No solid enhancing renal mass. Left kidney is unremarkable without hydronephrosis or nephrolithiasis.  Evaluation of the urinary  bladder is limited by urine contamination within this context there is no abnormal wall thickening or suspicious intraluminal filling defect identified.  Stomach/Bowel: No radiopaque enteric contrast was administered. Stomach is predominantly decompressed limiting evaluation. Small hiatal hernia. No pathologic dilation of large or small bowel. The appendix and terminal ileum appear normal. Extensive colonic diverticulosis without findings of acute diverticulitis.  Vascular/Lymphatic: Aortic and branch vessel atherosclerosis without abdominal aortic aneurysm. No pathologically enlarged abdominal or pelvic lymph nodes.  Reproductive: Mild enlargement of the prostate gland.  Other: No significant abdominopelvic free fluid.  No pneumoperitoneum.  Musculoskeletal: L5-S1 discogenic disease. No acute osseous abnormality.  IMPRESSION: 1. Obstructing 8 x 4 mm stone in the proximal right ureter with associated hydroureteronephrosis, consider correlation with laboratory values to exclude superimposed infection. 2. Cholelithiasis without findings of acute cholecystitis. 3. Extensive colonic diverticulosis without findings of acute diverticulitis. 4. Small hiatal hernia. 5.  Aortic Atherosclerosis (ICD10-I70.0).  These results will be called to the ordering clinician or representative by the Radiologist Assistant, and communication documented in the PACS or [employer].   Electronically Signed By: Dahlia Bailiff M.D. On: 02/02/2021 14:53  No results found for this or any previous visit.  No results found for this or any previous visit (from the past 24 hour(s)).

## 2021-02-10 ENCOUNTER — Other Ambulatory Visit: Payer: Self-pay | Admitting: Urology

## 2021-02-10 ENCOUNTER — Encounter (HOSPITAL_COMMUNITY)
Admission: RE | Disposition: A | Discharge status: Home / Self Care | Payer: Self-pay | Source: Home / Self Care | Attending: Urology

## 2021-02-10 ENCOUNTER — Ambulatory Visit (HOSPITAL_COMMUNITY): Payer: Medicare PPO

## 2021-02-10 ENCOUNTER — Ambulatory Visit (HOSPITAL_COMMUNITY)
Admission: RE | Admit: 2021-02-10 | Discharge: 2021-02-10 | Disposition: A | Discharge status: Home / Self Care | Payer: Medicare PPO | Attending: Urology | Admitting: Urology

## 2021-02-10 ENCOUNTER — Encounter (HOSPITAL_COMMUNITY): Payer: Self-pay | Admitting: Urology

## 2021-02-10 DIAGNOSIS — N132 Hydronephrosis with renal and ureteral calculous obstruction: Secondary | ICD-10-CM | POA: Diagnosis not present

## 2021-02-10 DIAGNOSIS — I252 Old myocardial infarction: Secondary | ICD-10-CM | POA: Diagnosis not present

## 2021-02-10 DIAGNOSIS — Z466 Encounter for fitting and adjustment of urinary device: Secondary | ICD-10-CM | POA: Diagnosis not present

## 2021-02-10 DIAGNOSIS — N202 Calculus of kidney with calculus of ureter: Secondary | ICD-10-CM | POA: Diagnosis present

## 2021-02-10 DIAGNOSIS — N201 Calculus of ureter: Secondary | ICD-10-CM

## 2021-02-10 DIAGNOSIS — I10 Essential (primary) hypertension: Secondary | ICD-10-CM | POA: Diagnosis not present

## 2021-02-10 DIAGNOSIS — Z79899 Other long term (current) drug therapy: Secondary | ICD-10-CM | POA: Diagnosis not present

## 2021-02-10 HISTORY — PX: EXTRACORPOREAL SHOCK WAVE LITHOTRIPSY: SHX1557

## 2021-02-10 SURGERY — LITHOTRIPSY, ESWL
Anesthesia: LOCAL | Laterality: Right

## 2021-02-10 MED ORDER — HYDROCODONE-ACETAMINOPHEN 5-325 MG PO TABS: 1.0000 | ORAL_TABLET | Freq: Four times a day (QID) | ORAL | 0 refills | Status: DC | PRN

## 2021-02-10 MED ORDER — DIPHENHYDRAMINE HCL 25 MG PO CAPS
25.0000 mg | ORAL_CAPSULE | ORAL | Status: AC
  Administered 2021-02-10: 25 mg via ORAL
  Filled 2021-02-10: qty 1

## 2021-02-10 MED ORDER — DIAZEPAM 5 MG PO TABS
10.0000 mg | ORAL_TABLET | Freq: Once | ORAL | Status: AC
  Administered 2021-02-10: 10 mg via ORAL
  Filled 2021-02-10: qty 2

## 2021-02-10 NOTE — Interval H&P Note (Signed)
History and Physical Interval Note:  02/10/2021 7:25 AM  Johnny Johns  has presented today for surgery, with the diagnosis of Right ureteral calculus.  The various methods of treatment have been discussed with the patient and family. After consideration of risks, benefits and other options for treatment, the patient has consented to  Procedure(s): EXTRACORPOREAL SHOCK WAVE LITHOTRIPSY (ESWL) (Right) as a surgical intervention.  The patient's history has been reviewed, patient examined, no change in status, stable for surgery.  I have reviewed the patient's chart and labs.  Questions were answered to the patient's satisfaction.     Di Kindle

## 2021-02-11 ENCOUNTER — Encounter (HOSPITAL_COMMUNITY): Payer: Self-pay | Admitting: Urology

## 2021-02-12 ENCOUNTER — Other Ambulatory Visit: Payer: Self-pay

## 2021-02-12 ENCOUNTER — Encounter: Payer: Self-pay | Admitting: Urology

## 2021-02-12 ENCOUNTER — Ambulatory Visit: Payer: Medicare PPO | Admitting: Urology

## 2021-02-12 VITALS — BP 110/75 | HR 94 | Wt 225.0 lb

## 2021-02-12 DIAGNOSIS — N201 Calculus of ureter: Secondary | ICD-10-CM

## 2021-02-12 DIAGNOSIS — N200 Calculus of kidney: Secondary | ICD-10-CM

## 2021-02-12 DIAGNOSIS — R339 Retention of urine, unspecified: Secondary | ICD-10-CM

## 2021-02-12 NOTE — Progress Notes (Signed)
Fill and Pull Catheter Removal  Patient is present today for a catheter removal.  Patient was cleaned and prepped in a sterile fashion 352ml of sterile water/ saline was instilled into the bladder when the patient felt the urge to urinate. 59ml of water was then drained from the balloon.  A 16FR foley cath was removed from the bladder no complications were noted .  Patient as then given some time to void on their own.  Patient can void  376ml on their own after some time.  Patient tolerated well.  Performed by: Estill Bamberg RN  Follow up/ Additional notes: MD to see

## 2021-02-12 NOTE — Progress Notes (Signed)
Assessment: 1. Urinary retention   2. Ureteral calculus, right     Plan: Foley removed today. Continue tamsulosin. Complete antibiotics. Return to office on 02/19/2021 with preappointment KUB for possible cystoscopy and stent removal.  Chief Complaint: Chief Complaint  Patient presents with   Urinary Retention    HPI: Johnny Johns is a 71 y.o. male who presents for continued evaluation of urinary retention and right ureteral calculus.  He underwent cystoscopy with right ureteroscopy and right ureteral stent placement on 02/06/2021.  He developed urinary retention postoperatively requiring placement of a Foley catheter in the emergency department on 02/07/2021.  He had approximately 700 mL in his bladder.  He was started on tamsulosin on 02/06/2021.  No problems voiding prior to his procedure.  He subsequently underwent right ESL on 02/10/2021.  His Foley catheter has been draining well.  He presents today for a voiding trial.  He continues on his antibiotics.     Portions of the above documentation were copied from a prior visit for review purposes only.  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   Presented with acute myocardial infarction In 01/2003; isolated 90% proximal LAD lesion treated with DES; EF 45-50%   Degenerative joint disease    Chronic neck and back pain   Hyperlipidemia    lipid profile in 06/2010:195, 207, 37, 117   Hypertension    IBS (irritable bowel syndrome)    Myocardial infarction (HCC)    PUD (peptic ulcer disease)    hx   Tobacco abuse, in remission     PSH: Past Surgical History:  Procedure Laterality Date    CARDIAC CATHETERIZATION     stent-2005   COLONOSCOPY  01/11/2006   CYSTOSCOPY W/ URETERAL STENT PLACEMENT Right 02/06/2021   Procedure: CYSTOSCOPY WITH RIGHT RETROGRADE PYELOGRAM/ RIGHT URETERAL STENT PLACEMENT/ RIGHT URETEROSCOPY;  Surgeon: Milderd Meager., MD;  Location: [facility];  Service: Urology;  Laterality: Right;   EXTRACORPOREAL SHOCK WAVE LITHOTRIPSY Right 02/10/2021   Procedure: EXTRACORPOREAL SHOCK WAVE LITHOTRIPSY (ESWL);  Surgeon: Milderd Meager., MD;  Location: [facility];  Service: Urology;  Laterality: Right;   KIDNEY STONE SURGERY  01/12/1988    SH: Social History   Tobacco Use   Smoking status: Former    Packs/day: 1.50    Years: 20.00    Pack years: 30.00    Types: Cigarettes    Quit date: 01/18/2003    Years since quitting: 18.0   Smokeless tobacco: Never  Vaping Use   Vaping Use: Never used  Substance Use Topics   Alcohol use: No   Drug use: No    ROS: Constitutional:  Negative for fever, chills, weight loss CV: Negative for chest pain, previous MI, hypertension Respiratory:  Negative for shortness of breath, wheezing, sleep apnea, frequent cough GI:  Negative for nausea, vomiting, bloody stool, GERD  PE: BP 110/75    Pulse 94    Wt 225 lb (102.1 kg)    BMI 32.28 kg/m  GENERAL APPEARANCE:  Well appearing, well developed, well nourished, NAD HEENT:  Atraumatic, normocephalic, oropharynx clear NECK:  Supple without lymphadenopathy or thyromegaly ABDOMEN:  Soft, non-tender, no masses EXTREMITIES:  Moves all extremities well, without clubbing, cyanosis, or edema NEUROLOGIC:  Alert and oriented x 3, normal gait, CN II-XII grossly intact MENTAL STATUS:  appropriate BACK:  Non-tender to palpation, No CVAT SKIN:  Warm, dry, and intact   Results: None  Voiding trial: Volume instilled: 300 ml Volume voided: 325 ml

## 2021-02-12 NOTE — Progress Notes (Signed)
Urological Symptom Review ° °Patient is experiencing the following symptoms: °none ° ° °Review of Systems ° °Gastrointestinal (upper)  : °Negative for upper GI symptoms ° °Gastrointestinal (lower) : °Negative for lower GI symptoms ° °Constitutional : °Negative for symptoms ° °Skin: °Negative for skin symptoms ° °Eyes: °Negative for eye symptoms ° °Ear/Nose/Throat : °Negative for Ear/Nose/Throat symptoms ° °Hematologic/Lymphatic: °Negative for Hematologic/Lymphatic symptoms ° °Cardiovascular : °Negative for cardiovascular symptoms ° °Respiratory : °Negative for respiratory symptoms ° °Endocrine: °Negative for endocrine symptoms ° °Musculoskeletal: °Back pain °Joint pain ° °Neurological: °Negative for neurological symptoms ° °Psychologic: °Negative for psychiatric symptoms °

## 2021-02-16 DIAGNOSIS — M542 Cervicalgia: Secondary | ICD-10-CM | POA: Diagnosis not present

## 2021-02-16 DIAGNOSIS — M546 Pain in thoracic spine: Secondary | ICD-10-CM | POA: Diagnosis not present

## 2021-02-16 DIAGNOSIS — M9901 Segmental and somatic dysfunction of cervical region: Secondary | ICD-10-CM | POA: Diagnosis not present

## 2021-02-16 DIAGNOSIS — M9902 Segmental and somatic dysfunction of thoracic region: Secondary | ICD-10-CM | POA: Diagnosis not present

## 2021-02-16 DIAGNOSIS — M9903 Segmental and somatic dysfunction of lumbar region: Secondary | ICD-10-CM | POA: Diagnosis not present

## 2021-02-18 DIAGNOSIS — M542 Cervicalgia: Secondary | ICD-10-CM | POA: Diagnosis not present

## 2021-02-18 DIAGNOSIS — M9903 Segmental and somatic dysfunction of lumbar region: Secondary | ICD-10-CM | POA: Diagnosis not present

## 2021-02-18 DIAGNOSIS — M9901 Segmental and somatic dysfunction of cervical region: Secondary | ICD-10-CM | POA: Diagnosis not present

## 2021-02-18 DIAGNOSIS — M546 Pain in thoracic spine: Secondary | ICD-10-CM | POA: Diagnosis not present

## 2021-02-18 DIAGNOSIS — M9902 Segmental and somatic dysfunction of thoracic region: Secondary | ICD-10-CM | POA: Diagnosis not present

## 2021-02-19 ENCOUNTER — Ambulatory Visit: Payer: Medicare PPO | Admitting: Urology

## 2021-02-19 ENCOUNTER — Encounter: Payer: Self-pay | Admitting: Urology

## 2021-02-19 ENCOUNTER — Ambulatory Visit (HOSPITAL_COMMUNITY)
Admission: RE | Admit: 2021-02-19 | Discharge: 2021-02-19 | Disposition: A | Discharge status: Home / Self Care | Payer: Medicare PPO | Source: Ambulatory Visit | Attending: Urology | Admitting: Urology

## 2021-02-19 ENCOUNTER — Other Ambulatory Visit: Payer: Self-pay

## 2021-02-19 VITALS — BP 117/76 | HR 84 | Wt 225.0 lb

## 2021-02-19 DIAGNOSIS — N201 Calculus of ureter: Secondary | ICD-10-CM | POA: Diagnosis not present

## 2021-02-19 DIAGNOSIS — N202 Calculus of kidney with calculus of ureter: Secondary | ICD-10-CM | POA: Diagnosis not present

## 2021-02-19 DIAGNOSIS — R8290 Unspecified abnormal findings in urine: Secondary | ICD-10-CM | POA: Diagnosis not present

## 2021-02-19 DIAGNOSIS — Z87898 Personal history of other specified conditions: Secondary | ICD-10-CM | POA: Insufficient documentation

## 2021-02-19 LAB — URINALYSIS, ROUTINE W REFLEX MICROSCOPIC
Bilirubin, UA: NEGATIVE
Nitrite, UA: POSITIVE — AB
Specific Gravity, UA: 1.02 (ref 1.005–1.030)
Urobilinogen, Ur: 2 mg/dL — ABNORMAL HIGH (ref 0.2–1.0)
pH, UA: 5 (ref 5.0–7.5)

## 2021-02-19 LAB — MICROSCOPIC EXAMINATION
RBC, Urine: >30 /hpf — AB (ref 0–2)
Renal Epithel, UA: NONE SEEN /hpf

## 2021-02-19 MED ORDER — CIPROFLOXACIN HCL 500 MG PO TABS: 500.0000 mg | ORAL_TABLET | Freq: Once | ORAL | Status: DC

## 2021-02-19 MED ORDER — CIPROFLOXACIN HCL 500 MG PO TABS: 500.0000 mg | ORAL_TABLET | Freq: Two times a day (BID) | ORAL | 0 refills | Status: AC

## 2021-02-19 MED ORDER — HYDROCODONE-ACETAMINOPHEN 5-325 MG PO TABS: 1.0000 | ORAL_TABLET | Freq: Four times a day (QID) | ORAL | 0 refills | Status: DC | PRN

## 2021-02-19 NOTE — Progress Notes (Addendum)
Assessment: 1. Ureteral calculus, right   2. History of urinary retention   3. Abnormal urine findings     Plan: I personally reviewed the KUB study from today showing the right ureteral stent in good position and apparent fragmentation of the right ureteral calculus. Continue tamsulosin. We will postpone stent removal today due to possible UTI. Urine culture sent today. BMP today Begin Cipro 500 mg BID x 5 days Return to office on 03/02/21 for cysto and stent removal (he will be out of town next week)  Chief Complaint: Chief Complaint  Patient presents with   ureteral calculus    HPI: Johnny Johns is a 71 y.o. male who presents for continued evaluation of a right ureteral calculus.  He underwent cystoscopy with right ureteroscopy and right ureteral stent placement on 02/06/2021.  He developed urinary retention postoperatively requiring placement of a Foley catheter in the emergency department on 02/07/2021.  He had approximately 700 mL in his bladder.  He was started on tamsulosin on 02/06/2021.  No problems voiding prior to his procedure.  He subsequently underwent right ESL on 02/10/2021.   His Foley was removed on 02/12/2021 after a successful voiding trial.  He returns today for follow-up.  He has been voiding spontaneously.  He continues on tamsulosin. He is having some minimal stent related symptoms.  No flank pain.  No fevers or chills. KUB from earlier today shows a right ureteral stent in good position and evidence of fragmentation of the proximal right ureteral calculus.  Portions of the above documentation were copied from a prior visit for review purposes only.  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   Presented with acute myocardial infarction In 01/2003; isolated 90% proximal LAD lesion treated with DES; EF 45-50%   Degenerative joint disease    Chronic neck and back pain   Hyperlipidemia    lipid profile in 06/2010:195, 207, 37, 117   Hypertension    IBS (irritable bowel syndrome)    Myocardial infarction (HCC)    PUD (peptic ulcer disease)    hx   Tobacco abuse, in remission     PSH: Past Surgical History:  Procedure Laterality Date   CARDIAC CATHETERIZATION     stent-2005   COLONOSCOPY  01/11/2006   CYSTOSCOPY W/ URETERAL STENT PLACEMENT Right 02/06/2021   Procedure: CYSTOSCOPY WITH RIGHT RETROGRADE PYELOGRAM/ RIGHT URETERAL STENT PLACEMENT/ RIGHT URETEROSCOPY;  Surgeon: Milderd Meager., MD;  Location: [facility];  Service: Urology;  Laterality: Right;   EXTRACORPOREAL SHOCK WAVE LITHOTRIPSY Right 02/10/2021   Procedure: EXTRACORPOREAL SHOCK WAVE LITHOTRIPSY (ESWL);  Surgeon: Milderd Meager., MD;  Location: [facility];  Service: Urology;  Laterality: Right;   KIDNEY STONE SURGERY  01/12/1988    SH: Social History   Tobacco Use   Smoking status: Former    Packs/day: 1.50    Years: 20.00    Pack years: 30.00    Types: Cigarettes    Quit date: 01/18/2003    Years since quitting: 18.1   Smokeless tobacco: Never  Vaping Use   Vaping Use: Never used  Substance Use Topics  Alcohol use: No   Drug use: No    ROS: Constitutional:  Negative for fever, chills, weight loss CV: Negative for chest pain, previous MI, hypertension Respiratory:  Negative for shortness of breath, wheezing, sleep apnea, frequent cough GI:  Negative for nausea, vomiting, bloody stool, GERD  PE: BP 117/76 (BP Location: Right Arm)    Pulse 84    Wt 225 lb (102.1 kg)    BMI 32.28 kg/m  GENERAL APPEARANCE:  Well appearing, well developed, well nourished, NAD HEENT:  Atraumatic, normocephalic, oropharynx clear NECK:   Supple without lymphadenopathy or thyromegaly ABDOMEN:  Soft, non-tender, no masses EXTREMITIES:  Moves all extremities well, without clubbing, cyanosis, or edema NEUROLOGIC:  Alert and oriented x 3, normal gait, CN II-XII grossly intact MENTAL STATUS:  appropriate BACK:  Non-tender to palpation, No CVAT SKIN:  Warm, dry, and intact  Results: U/A:  11-30 WBC, >30 RBC, many bacteria, nitrite positive

## 2021-02-20 ENCOUNTER — Ambulatory Visit: Payer: Medicare PPO | Admitting: Physician Assistant

## 2021-02-20 LAB — BASIC METABOLIC PANEL
BUN/Creatinine Ratio: 20 (ref 10–24)
BUN: 27 mg/dL (ref 8–27)
CO2: 20 mmol/L (ref 20–29)
Calcium: 9.4 mg/dL (ref 8.6–10.2)
Chloride: 105 mmol/L (ref 96–106)
Creatinine, Ser: 1.35 mg/dL — ABNORMAL HIGH (ref 0.76–1.27)
Glucose: 92 mg/dL (ref 70–99)
Potassium: 5 mmol/L (ref 3.5–5.2)
Sodium: 143 mmol/L (ref 134–144)
eGFR: 56 mL/min/{1.73_m2} — ABNORMAL LOW (ref >59)

## 2021-02-21 LAB — URINE CULTURE: Organism ID, Bacteria: NO GROWTH

## 2021-02-23 ENCOUNTER — Telehealth: Payer: Self-pay

## 2021-02-23 NOTE — Telephone Encounter (Signed)
-----   Message from Primus Bravo, MD sent at 02/23/2021  3:55 PM EST ----- Please notify patient that his urine culture did not show evidence of UTI.  Recommend completing antibiotics as prescribed.

## 2021-02-23 NOTE — Telephone Encounter (Signed)
Pt was called and a vm was left regarding ua

## 2021-02-24 ENCOUNTER — Ambulatory Visit: Payer: Medicare PPO | Admitting: Physician Assistant

## 2021-03-02 ENCOUNTER — Ambulatory Visit: Payer: Medicare PPO | Admitting: Urology

## 2021-03-02 ENCOUNTER — Other Ambulatory Visit: Payer: Self-pay

## 2021-03-02 VITALS — BP 121/77 | HR 80

## 2021-03-02 DIAGNOSIS — N201 Calculus of ureter: Secondary | ICD-10-CM

## 2021-03-02 LAB — URINALYSIS, ROUTINE W REFLEX MICROSCOPIC
Bilirubin, UA: NEGATIVE
Glucose, UA: NEGATIVE
Ketones, UA: NEGATIVE
Nitrite, UA: NEGATIVE
Specific Gravity, UA: 1.025 (ref 1.005–1.030)
Urobilinogen, Ur: 0.2 mg/dL (ref 0.2–1.0)
pH, UA: 5.5 (ref 5.0–7.5)

## 2021-03-02 LAB — MICROSCOPIC EXAMINATION
RBC, Urine: >30 /hpf — AB (ref 0–2)
Renal Epithel, UA: NONE SEEN /hpf

## 2021-03-02 MED ORDER — CIPROFLOXACIN HCL 500 MG PO TABS
500.0000 mg | ORAL_TABLET | Freq: Once | ORAL | Status: AC
  Administered 2021-03-02: 500 mg via ORAL

## 2021-03-02 NOTE — Progress Notes (Signed)
Assessment: 1. Ureteral calculus, right     Plan: Continue tamsulosin. Stent removed today. Cipro x1 following stent removal. Return to office in 2-3 weeks with previsit KUB.  Chief Complaint: Chief Complaint  Patient presents with   Ureteral calculus    HPI: Johnny Johns is a 71 y.o. male who presents for continued evaluation of a right ureteral calculus.  He underwent cystoscopy with right ureteroscopy and right ureteral stent placement on 02/06/2021.  He developed urinary retention postoperatively requiring placement of a Foley catheter in the emergency department on 02/07/2021.  He had approximately 700 mL in his bladder.  He was started on tamsulosin on 02/06/2021.  No problems voiding prior to his procedure.  He subsequently underwent right ESL on 02/10/2021.   His Foley was removed on 02/12/2021 after a successful voiding trial.  KUB from 02/19/21 showed a right ureteral stent in good position and evidence of fragmentation of the proximal right ureteral calculus. His stent was not removed on 02/19/2021 due to concern for possible UTI.  Urine culture showed no growth. He was treated with Cipro x5 days. He presents today for cystoscopy and stent removal.  He is doing well.  No fevers or chills.  He does have some mild stent symptoms.  He has completed his antibiotics.  Portions of the above documentation were copied from a prior visit for review purposes only.  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   Presented with acute myocardial infarction In 01/2003; isolated 90% proximal LAD lesion treated with DES; EF 45-50%   Degenerative joint  disease    Chronic neck and back pain   Hyperlipidemia    lipid profile in 06/2010:195, 207, 37, 117   Hypertension    IBS (irritable bowel syndrome)    Myocardial infarction (HCC)    PUD (peptic ulcer disease)    hx   Tobacco abuse, in remission     PSH: Past Surgical History:  Procedure Laterality Date   CARDIAC CATHETERIZATION     stent-2005   COLONOSCOPY  01/11/2006   CYSTOSCOPY W/ URETERAL STENT PLACEMENT Right 02/06/2021   Procedure: CYSTOSCOPY WITH RIGHT RETROGRADE PYELOGRAM/ RIGHT URETERAL STENT PLACEMENT/ RIGHT URETEROSCOPY;  Surgeon: Milderd Meager., MD;  Location: [facility];  Service: Urology;  Laterality: Right;   EXTRACORPOREAL SHOCK WAVE LITHOTRIPSY Right 02/10/2021   Procedure: EXTRACORPOREAL SHOCK WAVE LITHOTRIPSY (ESWL);  Surgeon: Milderd Meager., MD;  Location: [facility];  Service: Urology;  Laterality: Right;   KIDNEY STONE SURGERY  01/12/1988    SH: Social History   Tobacco Use   Smoking status: Former    Packs/day: 1.50    Years: 20.00    Pack years: 30.00    Types: Cigarettes    Quit date: 01/18/2003    Years since quitting: 18.1   Smokeless tobacco: Never  Vaping Use   Vaping Use: Never used  Substance Use Topics   Alcohol use: No   Drug use: No    ROS: Constitutional:  Negative for fever, chills, weight loss CV: Negative for chest pain, previous MI, hypertension Respiratory:  Negative for shortness of breath, wheezing, sleep apnea,  frequent cough GI:  Negative for nausea, vomiting, bloody stool, GERD  PE: BP 121/77    Pulse 80  GENERAL APPEARANCE:  Well appearing, well developed, well nourished, NAD HEENT:  Atraumatic, normocephalic, oropharynx clear NECK:  Supple without lymphadenopathy or thyromegaly ABDOMEN:  Soft, non-tender, no masses EXTREMITIES:  Moves all extremities well, without clubbing, cyanosis, or edema NEUROLOGIC:  Alert and oriented x 3, normal gait, CN II-XII grossly intact MENTAL STATUS:  appropriate BACK:   Non-tender to palpation, No CVAT SKIN:  Warm, dry, and intact  Results: U/A: 6-10 WBC, >30 RBC, few bacteria  Procedure:  Flexible Cystourethroscopy/ureteral calculus  Pre-operative Diagnosis:  Ureteral calculus  Post-operative Diagnosis:  Ureteral calculus  Anesthesia:  local with lidocaine jelly  Surgical Narrative:  After appropriate informed consent was obtained, the patient was prepped and draped in the usual sterile fashion in the supine position.  The patient was correctly identified and the proper procedure delineated prior to proceeding.  Sterile lidocaine gel was instilled in the urethra. The flexible cystoscope was introduced without difficulty. The right ureteral stent was identified.  Using stent graspers, the ureteral stent was removed.  The cystoscope was removed as well.  The patient tolerated the procedure well.

## 2021-03-03 ENCOUNTER — Ambulatory Visit (HOSPITAL_COMMUNITY)
Admission: RE | Admit: 2021-03-03 | Discharge: 2021-03-03 | Disposition: A | Discharge status: Home / Self Care | Payer: Medicare PPO | Source: Ambulatory Visit | Attending: Orthopaedic Surgery | Admitting: Orthopaedic Surgery

## 2021-03-03 DIAGNOSIS — M542 Cervicalgia: Secondary | ICD-10-CM | POA: Insufficient documentation

## 2021-03-03 DIAGNOSIS — M47812 Spondylosis without myelopathy or radiculopathy, cervical region: Secondary | ICD-10-CM | POA: Insufficient documentation

## 2021-03-03 DIAGNOSIS — M2578 Osteophyte, vertebrae: Secondary | ICD-10-CM | POA: Diagnosis not present

## 2021-03-03 DIAGNOSIS — M4312 Spondylolisthesis, cervical region: Secondary | ICD-10-CM | POA: Diagnosis not present

## 2021-03-05 ENCOUNTER — Other Ambulatory Visit: Payer: Self-pay | Admitting: Radiology

## 2021-03-05 ENCOUNTER — Other Ambulatory Visit: Payer: Self-pay

## 2021-03-05 DIAGNOSIS — Q761 Klippel-Feil syndrome: Secondary | ICD-10-CM

## 2021-03-05 DIAGNOSIS — M47812 Spondylosis without myelopathy or radiculopathy, cervical region: Secondary | ICD-10-CM

## 2021-03-05 DIAGNOSIS — N200 Calculus of kidney: Secondary | ICD-10-CM | POA: Diagnosis not present

## 2021-03-05 DIAGNOSIS — M542 Cervicalgia: Secondary | ICD-10-CM

## 2021-03-09 LAB — CALCULI, WITH PHOTOGRAPH (CLINICAL LAB)
Calcium Oxalate Dihydrate: 20 %
Calcium Oxalate Monohydrate: 80 %
Weight Calculi: 88 mg

## 2021-03-10 ENCOUNTER — Other Ambulatory Visit: Payer: Self-pay | Admitting: Urology

## 2021-03-13 ENCOUNTER — Ambulatory Visit (HOSPITAL_COMMUNITY)
Admission: RE | Admit: 2021-03-13 | Discharge: 2021-03-13 | Disposition: A | Discharge status: Home / Self Care | Payer: Medicare PPO | Source: Ambulatory Visit | Attending: Urology | Admitting: Urology

## 2021-03-13 ENCOUNTER — Other Ambulatory Visit: Payer: Self-pay

## 2021-03-13 DIAGNOSIS — Z87442 Personal history of urinary calculi: Secondary | ICD-10-CM | POA: Diagnosis not present

## 2021-03-13 DIAGNOSIS — N201 Calculus of ureter: Secondary | ICD-10-CM | POA: Insufficient documentation

## 2021-03-16 ENCOUNTER — Other Ambulatory Visit: Payer: Self-pay

## 2021-03-16 ENCOUNTER — Ambulatory Visit: Payer: Medicare PPO | Admitting: Urology

## 2021-03-16 ENCOUNTER — Encounter: Payer: Self-pay | Admitting: Urology

## 2021-03-16 VITALS — BP 124/77 | HR 73 | Ht 70.0 in | Wt 225.0 lb

## 2021-03-16 DIAGNOSIS — N401 Enlarged prostate with lower urinary tract symptoms: Secondary | ICD-10-CM

## 2021-03-16 DIAGNOSIS — N138 Other obstructive and reflux uropathy: Secondary | ICD-10-CM

## 2021-03-16 DIAGNOSIS — N201 Calculus of ureter: Secondary | ICD-10-CM

## 2021-03-16 DIAGNOSIS — Z87898 Personal history of other specified conditions: Secondary | ICD-10-CM

## 2021-03-16 LAB — URINALYSIS, ROUTINE W REFLEX MICROSCOPIC
Bilirubin, UA: NEGATIVE
Glucose, UA: NEGATIVE
Ketones, UA: NEGATIVE
Leukocytes,UA: NEGATIVE
Nitrite, UA: NEGATIVE
Protein,UA: NEGATIVE
Specific Gravity, UA: 1.02 (ref 1.005–1.030)
Urobilinogen, Ur: 0.2 mg/dL (ref 0.2–1.0)
pH, UA: 5.5 (ref 5.0–7.5)

## 2021-03-16 MED ORDER — TAMSULOSIN HCL 0.4 MG PO CAPS: 0.4000 mg | ORAL_CAPSULE | Freq: Every day | ORAL | 11 refills | Status: DC

## 2021-03-16 NOTE — Progress Notes (Signed)
? ?Assessment: ?1. Ureteral calculus, right   ?2. History of urinary retention   ?3. BPH with obstruction/lower urinary tract symptoms   ? ? ? ?Plan: ?I personally reviewed the KUB from 03/13/2021.  No obvious stone fragments were seen in the expected course of the right ureter. ?Continue tamsulosin. ?Return to office in 1 month with renal ultrasound prior to visit. ? ?Chief Complaint: ?Chief Complaint  ?Patient presents with  ? ureteral calculus  ? ? ?HPI: ?Johnny Johns is a 71 y.o. male who presents for continued evaluation of a right ureteral calculus.  He underwent cystoscopy with right ureteroscopy and right ureteral stent placement on 02/06/2021.  He developed urinary retention postoperatively requiring placement of a Foley catheter in the emergency department on 02/07/2021.  He had approximately 700 mL in his bladder.  He was started on tamsulosin on 02/06/2021.  No problems voiding prior to his procedure.  He subsequently underwent right ESL on 02/10/2021.   ?His Foley was removed on 02/12/2021 after a successful voiding trial. ? ?KUB from 02/19/21 showed a right ureteral stent in good position and evidence of fragmentation of the proximal right ureteral calculus. ?His stent was not removed on 02/19/2021 due to concern for possible UTI.  Urine culture showed no growth. ?He was treated with Cipro x5 days. ?His right ureteral stent was removed on 03/02/2021. ?KUB from 03/13/2021 showed no obvious calcification along the expected course of the right ureter. ? ?He returns today for follow-up.  He has done well following stent removal.  He passed a number of small stone fragments.  No recent stone symptoms.  No dysuria or gross hematuria.  He continues on tamsulosin with good control of his lower urinary tract symptoms. ?IPSS = 5 today. ? ?Portions of the above documentation were copied from a prior visit for review purposes only. ? ?Allergies: ?Allergies  ?Allergen Reactions  ? Gemfibrozil   ? Metoprolol Other (See  Comments)  ?  Myalgias and neck pain  ? Simvastatin Other (See Comments)  ?  Myalgias; also Vytorin, pravastatin and Lipitor  ? Statins Other (See Comments)  ?  Muscle aches  ? Vioxx [Rofecoxib]   ? ? ?PMH: ?Past Medical History:  ?Diagnosis Date  ? 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 Presented with acute myocardial infarction In 01/2003; isolated 90% proximal LAD lesion treated with DES; EF 45-50%  ? Degenerative joint disease   ? Chronic neck and back pain  ? Hyperlipidemia   ? lipid profile in 06/2010:195, 207, 37, 117  ? Hypertension   ? IBS (irritable bowel syndrome)   ? Myocardial infarction [facility])   ? PUD (peptic ulcer disease)   ? hx  ? Tobacco abuse, in remission   ? ? ?PSH: ?Past Surgical History:  ?Procedure Laterality Date  ? CARDIAC CATHETERIZATION    ? stent-2005  ? COLONOSCOPY  01/11/2006  ? CYSTOSCOPY W/ URETERAL STENT PLACEMENT Right 02/06/2021  ? Procedure: CYSTOSCOPY WITH RIGHT RETROGRADE PYELOGRAM/ RIGHT URETERAL STENT PLACEMENT/ RIGHT URETEROSCOPY;  Surgeon: Milderd Meager., MD;  Location: [facility];  Service: Urology;  Laterality: Right;  ? EXTRACORPOREAL SHOCK WAVE LITHOTRIPSY Right 02/10/2021  ? Procedure: EXTRACORPOREAL SHOCK WAVE LITHOTRIPSY (ESWL);  Surgeon: Milderd Meager., MD;  Location: [facility];  Service: Urology;  Laterality: Right;  ? KIDNEY STONE SURGERY  01/12/1988  ? ? ?SH: ?Social History  ? ?Tobacco Use  ? Smoking status: Former  ?  Packs/day: 1.50  ?  Years: 20.00  ?  Pack years:  30.00  ?  Types: Cigarettes  ?  Quit date: 01/18/2003  ?  Years since quitting: 18.1  ? Smokeless tobacco: Never  ?Vaping Use  ? Vaping Use: Never used  ?Substance Use Topics  ? Alcohol use: No  ? Drug use: No  ? ? ?ROS: ?Constitutional:  Negative for fever, chills, weight loss ?CV: Negative for chest pain, previous MI, hypertension ?Respiratory:  Negative for shortness of breath, wheezing, sleep apnea, frequent cough ?GI:  Negative for nausea, vomiting, bloody stool,  GERD ? ?PE: ?BP 124/77   Pulse 73   Ht 5\' 10"  (1.778 m)   Wt 225 lb (102.1 kg)   BMI 32.28 kg/m?  ?GENERAL APPEARANCE:  Well appearing, well developed, well nourished, NAD ?HEENT:  Atraumatic, normocephalic, oropharynx clear ?NECK:  Supple without lymphadenopathy or thyromegaly ?ABDOMEN:  Soft, non-tender, no masses ?EXTREMITIES:  Moves all extremities well, without clubbing, cyanosis, or edema ?NEUROLOGIC:  Alert and oriented x 3, normal gait, CN II-XII grossly intact ?MENTAL STATUS:  appropriate ?BACK:  Non-tender to palpation, No CVAT ?SKIN:  Warm, dry, and intact ? ? ?Results: ?U/A dipstick trace blood ? ? ?

## 2021-03-19 DIAGNOSIS — Z0001 Encounter for general adult medical examination with abnormal findings: Secondary | ICD-10-CM | POA: Diagnosis not present

## 2021-03-19 DIAGNOSIS — E785 Hyperlipidemia, unspecified: Secondary | ICD-10-CM | POA: Diagnosis not present

## 2021-03-19 DIAGNOSIS — I10 Essential (primary) hypertension: Secondary | ICD-10-CM | POA: Diagnosis not present

## 2021-04-06 ENCOUNTER — Ambulatory Visit (HOSPITAL_COMMUNITY)
Admission: RE | Admit: 2021-04-06 | Discharge: 2021-04-06 | Disposition: A | Discharge status: Home / Self Care | Payer: Medicare PPO | Source: Ambulatory Visit | Attending: Urology | Admitting: Urology

## 2021-04-06 ENCOUNTER — Other Ambulatory Visit: Payer: Self-pay

## 2021-04-06 DIAGNOSIS — N201 Calculus of ureter: Secondary | ICD-10-CM | POA: Insufficient documentation

## 2021-04-06 DIAGNOSIS — N1330 Unspecified hydronephrosis: Secondary | ICD-10-CM | POA: Diagnosis not present

## 2021-04-07 DIAGNOSIS — M4712 Other spondylosis with myelopathy, cervical region: Secondary | ICD-10-CM | POA: Diagnosis not present

## 2021-04-07 DIAGNOSIS — Q761 Klippel-Feil syndrome: Secondary | ICD-10-CM | POA: Diagnosis not present

## 2021-04-07 DIAGNOSIS — M542 Cervicalgia: Secondary | ICD-10-CM | POA: Diagnosis not present

## 2021-04-07 DIAGNOSIS — M4722 Other spondylosis with radiculopathy, cervical region: Secondary | ICD-10-CM | POA: Diagnosis not present

## 2021-04-07 DIAGNOSIS — M47812 Spondylosis without myelopathy or radiculopathy, cervical region: Secondary | ICD-10-CM | POA: Diagnosis not present

## 2021-04-07 DIAGNOSIS — M4802 Spinal stenosis, cervical region: Secondary | ICD-10-CM | POA: Diagnosis not present

## 2021-04-11 HISTORY — PX: CERVICAL FUSION: SHX112

## 2021-04-13 ENCOUNTER — Ambulatory Visit (INDEPENDENT_AMBULATORY_CARE_PROVIDER_SITE_OTHER): Payer: Medicare PPO | Admitting: Urology

## 2021-04-13 ENCOUNTER — Encounter: Payer: Self-pay | Admitting: Urology

## 2021-04-13 VITALS — BP 127/81 | HR 78 | Ht 70.0 in | Wt 230.0 lb

## 2021-04-13 DIAGNOSIS — N138 Other obstructive and reflux uropathy: Secondary | ICD-10-CM | POA: Insufficient documentation

## 2021-04-13 DIAGNOSIS — N201 Calculus of ureter: Secondary | ICD-10-CM

## 2021-04-13 DIAGNOSIS — N400 Enlarged prostate without lower urinary tract symptoms: Secondary | ICD-10-CM | POA: Insufficient documentation

## 2021-04-13 DIAGNOSIS — N401 Enlarged prostate with lower urinary tract symptoms: Secondary | ICD-10-CM

## 2021-04-13 LAB — URINALYSIS, ROUTINE W REFLEX MICROSCOPIC
Bilirubin, UA: NEGATIVE
Glucose, UA: NEGATIVE
Ketones, UA: NEGATIVE
Nitrite, UA: NEGATIVE
Specific Gravity, UA: >1.03 — ABNORMAL HIGH (ref 1.005–1.030)
Urobilinogen, Ur: 0.2 mg/dL (ref 0.2–1.0)
pH, UA: 5.5 (ref 5.0–7.5)

## 2021-04-13 LAB — MICROSCOPIC EXAMINATION
Bacteria, UA: NONE SEEN
Epithelial Cells (non renal): NONE SEEN /hpf (ref 0–10)

## 2021-04-13 NOTE — Progress Notes (Signed)
? ?Assessment: ?1. Ureteral calculus, right   ?2. BPH with obstruction/lower urinary tract symptoms   ? ? ?Plan: ?I personally reviewed the renal ultrasound from 04/06/2021 with results as noted below. ?Stone analysis results discussed with the patient.  Stone prevention reviewed and information provided. ?BMP today due to prior abnormal renal function associated with ureteral calculus. ?Recommend follow-up renal ultrasound in 2-3 months. ?Continue tamsulosin. ?Return to office in 3 months. ? ?Chief Complaint: ?Chief Complaint  ?Patient presents with  ? ureteral calculus  ? ? ?HPI: ?Johnny Johns is a 71 y.o. male who presents for continued evaluation of a right ureteral calculus.  He underwent cystoscopy with right ureteroscopy and right ureteral stent placement on 02/06/2021.  He developed urinary retention postoperatively requiring placement of a Foley catheter in the emergency department on 02/07/2021.  He had approximately 700 mL in his bladder.  He was started on tamsulosin on 02/06/2021.  No problems voiding prior to his procedure.  He subsequently underwent right ESL on 02/10/2021.   ?His Foley was removed on 02/12/2021 after a successful voiding trial. ? ?KUB from 02/19/21 showed a right ureteral stent in good position and evidence of fragmentation of the proximal right ureteral calculus. ?His stent was not removed on 02/19/2021 due to concern for possible UTI.  Urine culture showed no growth. ?He was treated with Cipro x5 days. ?His right ureteral stent was removed on 03/02/2021. ?KUB from 03/13/2021 showed no obvious calcification along the expected course of the right ureter. ?He passed a number of small stone fragments.  He continued on tamsulosin with good control of his lower urinary tract symptoms. ?IPSS = 5. ?Stone analysis: 80% calcium oxalate monohydrate, 20% calcium oxalate dihydrate. ? ?Renal ultrasound from 04/06/2021 showed mild right hydronephrosis. ? ?He returns today for follow-up.  He has done well  since his last visit.  No flank pain.  He is not aware of passing any additional stone fragments.  No dysuria or gross hematuria.  He continues on tamsulosin with good control of his urinary symptoms. ?IPSS = 8 today. ? ?Portions of the above documentation were copied from a prior visit for review purposes only. ? ?Allergies: ?Allergies  ?Allergen Reactions  ? Gemfibrozil   ? Metoprolol Other (See Comments)  ?  Myalgias and neck pain  ? Simvastatin Other (See Comments)  ?  Myalgias; also Vytorin, pravastatin and Lipitor  ? Statins Other (See Comments)  ?  Muscle aches  ? Vioxx [Rofecoxib]   ? ? ?PMH: ?Past Medical History:  ?Diagnosis Date  ? 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 Presented with acute myocardial infarction In 01/2003; isolated 90% proximal LAD lesion treated with DES; EF 45-50%  ? Degenerative joint disease   ? Chronic neck and back pain  ? Hyperlipidemia   ? lipid profile in 06/2010:195, 207, 37, 117  ? Hypertension   ? IBS (irritable bowel syndrome)   ? Myocardial infarction [facility])   ? PUD (peptic ulcer disease)   ? hx  ? Tobacco abuse, in remission   ? ? ?PSH: ?Past Surgical History:  ?Procedure Laterality Date  ? CARDIAC CATHETERIZATION    ? stent-2005  ? COLONOSCOPY  01/11/2006  ? CYSTOSCOPY W/ URETERAL STENT PLACEMENT Right 02/06/2021  ? Procedure: CYSTOSCOPY WITH RIGHT RETROGRADE PYELOGRAM/ RIGHT URETERAL STENT PLACEMENT/ RIGHT URETEROSCOPY;  Surgeon: Milderd Meager., MD;  Location: [facility];  Service: Urology;  Laterality: Right;  ? EXTRACORPOREAL SHOCK WAVE LITHOTRIPSY Right 02/10/2021  ? Procedure: EXTRACORPOREAL SHOCK WAVE LITHOTRIPSY (  ESWL);  Surgeon: Milderd Meager., MD;  Location: [facility];  Service: Urology;  Laterality: Right;  ? KIDNEY STONE SURGERY  01/12/1988  ? ? ?SH: ?Social History  ? ?Tobacco Use  ? Smoking status: Former  ?  Packs/day: 1.50  ?  Years: 20.00  ?  Pack years: 30.00  ?  Types: Cigarettes  ?  Quit date: 01/18/2003  ?  Years since quitting:  18.2  ? Smokeless tobacco: Never  ?Vaping Use  ? Vaping Use: Never used  ?Substance Use Topics  ? Alcohol use: No  ? Drug use: No  ? ? ?ROS: ?Constitutional:  Negative for fever, chills, weight loss ?CV: Negative for chest pain, previous MI, hypertension ?Respiratory:  Negative for shortness of breath, wheezing, sleep apnea, frequent cough ?GI:  Negative for nausea, vomiting, bloody stool, GERD ? ?PE: ?BP 127/81   Pulse 78   Ht 5\' 10"  (1.778 m)   Wt 230 lb (104.3 kg)   BMI 33.00 kg/m?  ?GENERAL APPEARANCE:  Well appearing, well developed, well nourished, NAD ?HEENT:  Atraumatic, normocephalic, oropharynx clear ?NECK:  Supple without lymphadenopathy or thyromegaly ?ABDOMEN:  Soft, non-tender, no masses ?EXTREMITIES:  Moves all extremities well, without clubbing, cyanosis, or edema ?NEUROLOGIC:  Alert and oriented x 3, normal gait, CN II-XII grossly intact ?MENTAL STATUS:  appropriate ?BACK:  Non-tender to palpation, No CVAT ?SKIN:  Warm, dry, and intact ? ? ?Results: ?U/A: 0-5 WBCs, 0-2 RBCs ? ?

## 2021-04-14 LAB — BASIC METABOLIC PANEL
BUN/Creatinine Ratio: 13 (ref 10–24)
BUN: 16 mg/dL (ref 8–27)
CO2: 24 mmol/L (ref 20–29)
Calcium: 9.2 mg/dL (ref 8.6–10.2)
Chloride: 105 mmol/L (ref 96–106)
Creatinine, Ser: 1.2 mg/dL (ref 0.76–1.27)
Glucose: 99 mg/dL (ref 70–99)
Potassium: 4.6 mmol/L (ref 3.5–5.2)
Sodium: 140 mmol/L (ref 134–144)
eGFR: 64 mL/min/{1.73_m2} (ref >59)

## 2021-04-22 DIAGNOSIS — H43813 Vitreous degeneration, bilateral: Secondary | ICD-10-CM | POA: Diagnosis not present

## 2021-04-22 DIAGNOSIS — H04123 Dry eye syndrome of bilateral lacrimal glands: Secondary | ICD-10-CM | POA: Diagnosis not present

## 2021-04-22 DIAGNOSIS — Z961 Presence of intraocular lens: Secondary | ICD-10-CM | POA: Diagnosis not present

## 2021-04-22 DIAGNOSIS — H02831 Dermatochalasis of right upper eyelid: Secondary | ICD-10-CM | POA: Diagnosis not present

## 2021-04-22 DIAGNOSIS — H02834 Dermatochalasis of left upper eyelid: Secondary | ICD-10-CM | POA: Diagnosis not present

## 2021-04-30 DIAGNOSIS — Z87891 Personal history of nicotine dependence: Secondary | ICD-10-CM | POA: Diagnosis not present

## 2021-04-30 DIAGNOSIS — M4802 Spinal stenosis, cervical region: Secondary | ICD-10-CM | POA: Diagnosis not present

## 2021-04-30 DIAGNOSIS — R9431 Abnormal electrocardiogram [ECG] [EKG]: Secondary | ICD-10-CM | POA: Diagnosis not present

## 2021-04-30 DIAGNOSIS — Z01818 Encounter for other preprocedural examination: Secondary | ICD-10-CM | POA: Diagnosis not present

## 2021-04-30 DIAGNOSIS — I2510 Atherosclerotic heart disease of native coronary artery without angina pectoris: Secondary | ICD-10-CM | POA: Diagnosis not present

## 2021-05-01 DIAGNOSIS — I2510 Atherosclerotic heart disease of native coronary artery without angina pectoris: Secondary | ICD-10-CM | POA: Diagnosis not present

## 2021-05-01 DIAGNOSIS — M4722 Other spondylosis with radiculopathy, cervical region: Secondary | ICD-10-CM | POA: Diagnosis not present

## 2021-05-01 DIAGNOSIS — I10 Essential (primary) hypertension: Secondary | ICD-10-CM | POA: Diagnosis not present

## 2021-05-01 DIAGNOSIS — Q761 Klippel-Feil syndrome: Secondary | ICD-10-CM | POA: Diagnosis not present

## 2021-05-01 DIAGNOSIS — I252 Old myocardial infarction: Secondary | ICD-10-CM | POA: Diagnosis not present

## 2021-05-01 DIAGNOSIS — M2578 Osteophyte, vertebrae: Secondary | ICD-10-CM | POA: Diagnosis not present

## 2021-05-01 DIAGNOSIS — Z955 Presence of coronary angioplasty implant and graft: Secondary | ICD-10-CM | POA: Diagnosis not present

## 2021-05-01 DIAGNOSIS — M4802 Spinal stenosis, cervical region: Secondary | ICD-10-CM | POA: Diagnosis not present

## 2021-05-01 DIAGNOSIS — M4712 Other spondylosis with myelopathy, cervical region: Secondary | ICD-10-CM | POA: Diagnosis not present

## 2021-05-02 DIAGNOSIS — M4722 Other spondylosis with radiculopathy, cervical region: Secondary | ICD-10-CM | POA: Diagnosis not present

## 2021-05-02 DIAGNOSIS — I252 Old myocardial infarction: Secondary | ICD-10-CM | POA: Diagnosis not present

## 2021-05-02 DIAGNOSIS — Z955 Presence of coronary angioplasty implant and graft: Secondary | ICD-10-CM | POA: Diagnosis not present

## 2021-05-02 DIAGNOSIS — M4802 Spinal stenosis, cervical region: Secondary | ICD-10-CM | POA: Diagnosis not present

## 2021-05-02 DIAGNOSIS — M2578 Osteophyte, vertebrae: Secondary | ICD-10-CM | POA: Diagnosis not present

## 2021-05-02 DIAGNOSIS — Q761 Klippel-Feil syndrome: Secondary | ICD-10-CM | POA: Diagnosis not present

## 2021-05-02 DIAGNOSIS — M4712 Other spondylosis with myelopathy, cervical region: Secondary | ICD-10-CM | POA: Diagnosis not present

## 2021-05-02 DIAGNOSIS — I2510 Atherosclerotic heart disease of native coronary artery without angina pectoris: Secondary | ICD-10-CM | POA: Diagnosis not present

## 2021-05-02 DIAGNOSIS — I10 Essential (primary) hypertension: Secondary | ICD-10-CM | POA: Diagnosis not present

## 2021-05-14 ENCOUNTER — Observation Stay (HOSPITAL_BASED_OUTPATIENT_CLINIC_OR_DEPARTMENT_OTHER)
Admission: EM | Admit: 2021-05-14 | Discharge: 2021-05-16 | Disposition: A | Discharge status: Home / Self Care | Payer: Medicare PPO | Source: Home / Self Care | Attending: Emergency Medicine | Admitting: Emergency Medicine

## 2021-05-14 ENCOUNTER — Emergency Department (HOSPITAL_COMMUNITY): Payer: Medicare PPO

## 2021-05-14 ENCOUNTER — Encounter (HOSPITAL_COMMUNITY): Payer: Self-pay

## 2021-05-14 ENCOUNTER — Other Ambulatory Visit: Payer: Self-pay

## 2021-05-14 DIAGNOSIS — R1011 Right upper quadrant pain: Secondary | ICD-10-CM | POA: Diagnosis not present

## 2021-05-14 DIAGNOSIS — N179 Acute kidney failure, unspecified: Secondary | ICD-10-CM | POA: Diagnosis present

## 2021-05-14 DIAGNOSIS — R7989 Other specified abnormal findings of blood chemistry: Secondary | ICD-10-CM | POA: Diagnosis present

## 2021-05-14 DIAGNOSIS — R140 Abdominal distension (gaseous): Secondary | ICD-10-CM | POA: Diagnosis not present

## 2021-05-14 DIAGNOSIS — R0789 Other chest pain: Secondary | ICD-10-CM | POA: Diagnosis present

## 2021-05-14 DIAGNOSIS — K8012 Calculus of gallbladder with acute and chronic cholecystitis without obstruction: Secondary | ICD-10-CM | POA: Diagnosis not present

## 2021-05-14 DIAGNOSIS — R110 Nausea: Secondary | ICD-10-CM | POA: Diagnosis not present

## 2021-05-14 DIAGNOSIS — Z8711 Personal history of peptic ulcer disease: Secondary | ICD-10-CM | POA: Diagnosis not present

## 2021-05-14 DIAGNOSIS — K82A1 Gangrene of gallbladder in cholecystitis: Secondary | ICD-10-CM | POA: Diagnosis present

## 2021-05-14 DIAGNOSIS — I2583 Coronary atherosclerosis due to lipid rich plaque: Secondary | ICD-10-CM | POA: Diagnosis not present

## 2021-05-14 DIAGNOSIS — I252 Old myocardial infarction: Secondary | ICD-10-CM | POA: Diagnosis not present

## 2021-05-14 DIAGNOSIS — D72829 Elevated white blood cell count, unspecified: Secondary | ICD-10-CM | POA: Diagnosis not present

## 2021-05-14 DIAGNOSIS — N201 Calculus of ureter: Secondary | ICD-10-CM | POA: Diagnosis present

## 2021-05-14 DIAGNOSIS — I10 Essential (primary) hypertension: Secondary | ICD-10-CM | POA: Diagnosis not present

## 2021-05-14 DIAGNOSIS — K8020 Calculus of gallbladder without cholecystitis without obstruction: Secondary | ICD-10-CM | POA: Diagnosis not present

## 2021-05-14 DIAGNOSIS — I2510 Atherosclerotic heart disease of native coronary artery without angina pectoris: Secondary | ICD-10-CM | POA: Diagnosis present

## 2021-05-14 DIAGNOSIS — E785 Hyperlipidemia, unspecified: Secondary | ICD-10-CM | POA: Diagnosis present

## 2021-05-14 DIAGNOSIS — Z87891 Personal history of nicotine dependence: Secondary | ICD-10-CM | POA: Diagnosis not present

## 2021-05-14 DIAGNOSIS — E7800 Pure hypercholesterolemia, unspecified: Secondary | ICD-10-CM | POA: Diagnosis not present

## 2021-05-14 DIAGNOSIS — M47817 Spondylosis without myelopathy or radiculopathy, lumbosacral region: Secondary | ICD-10-CM | POA: Diagnosis not present

## 2021-05-14 DIAGNOSIS — K589 Irritable bowel syndrome without diarrhea: Secondary | ICD-10-CM | POA: Diagnosis present

## 2021-05-14 DIAGNOSIS — K810 Acute cholecystitis: Secondary | ICD-10-CM | POA: Diagnosis not present

## 2021-05-14 DIAGNOSIS — R079 Chest pain, unspecified: Secondary | ICD-10-CM | POA: Diagnosis not present

## 2021-05-14 DIAGNOSIS — A419 Sepsis, unspecified organism: Secondary | ICD-10-CM | POA: Diagnosis present

## 2021-05-14 DIAGNOSIS — E782 Mixed hyperlipidemia: Secondary | ICD-10-CM | POA: Diagnosis present

## 2021-05-14 DIAGNOSIS — J432 Centrilobular emphysema: Secondary | ICD-10-CM | POA: Diagnosis present

## 2021-05-14 DIAGNOSIS — I700 Atherosclerosis of aorta: Secondary | ICD-10-CM | POA: Diagnosis not present

## 2021-05-14 DIAGNOSIS — K5730 Diverticulosis of large intestine without perforation or abscess without bleeding: Secondary | ICD-10-CM | POA: Diagnosis not present

## 2021-05-14 DIAGNOSIS — N138 Other obstructive and reflux uropathy: Secondary | ICD-10-CM

## 2021-05-14 DIAGNOSIS — K219 Gastro-esophageal reflux disease without esophagitis: Secondary | ICD-10-CM | POA: Diagnosis present

## 2021-05-14 DIAGNOSIS — K8000 Calculus of gallbladder with acute cholecystitis without obstruction: Secondary | ICD-10-CM | POA: Diagnosis present

## 2021-05-14 DIAGNOSIS — Z79899 Other long term (current) drug therapy: Secondary | ICD-10-CM | POA: Insufficient documentation

## 2021-05-14 DIAGNOSIS — Z87442 Personal history of urinary calculi: Secondary | ICD-10-CM | POA: Diagnosis not present

## 2021-05-14 DIAGNOSIS — N400 Enlarged prostate without lower urinary tract symptoms: Secondary | ICD-10-CM

## 2021-05-14 DIAGNOSIS — N1831 Chronic kidney disease, stage 3a: Secondary | ICD-10-CM

## 2021-05-14 DIAGNOSIS — R7401 Elevation of levels of liver transaminase levels: Secondary | ICD-10-CM | POA: Diagnosis not present

## 2021-05-14 DIAGNOSIS — I129 Hypertensive chronic kidney disease with stage 1 through stage 4 chronic kidney disease, or unspecified chronic kidney disease: Secondary | ICD-10-CM | POA: Insufficient documentation

## 2021-05-14 DIAGNOSIS — D631 Anemia in chronic kidney disease: Secondary | ICD-10-CM | POA: Diagnosis present

## 2021-05-14 DIAGNOSIS — Z888 Allergy status to other drugs, medicaments and biological substances status: Secondary | ICD-10-CM | POA: Diagnosis not present

## 2021-05-14 DIAGNOSIS — Z955 Presence of coronary angioplasty implant and graft: Secondary | ICD-10-CM | POA: Diagnosis not present

## 2021-05-14 MED ORDER — ASPIRIN 81 MG PO CHEW
324.0000 mg | CHEWABLE_TABLET | Freq: Once | ORAL | Status: AC
  Administered 2021-05-14: 324 mg via ORAL

## 2021-05-14 MED ORDER — ASPIRIN EC 81 MG PO TBEC
324.0000 mg | DELAYED_RELEASE_TABLET | Freq: Every day | ORAL | Status: DC
  Administered 2021-05-15 – 2021-05-16 (×2): 324 mg via ORAL
  Filled 2021-05-14 (×3): qty 4

## 2021-05-14 MED ORDER — NITROGLYCERIN 0.4 MG SL SUBL
0.4000 mg | SUBLINGUAL_TABLET | SUBLINGUAL | Status: AC | PRN
  Administered 2021-05-14 – 2021-05-15 (×3): 0.4 mg via SUBLINGUAL
  Filled 2021-05-14: qty 1

## 2021-05-14 NOTE — ED Triage Notes (Signed)
Reports cp x 2 hour with nausea.  Pt is alert and oriented.  Pt reports had neck surgery 2 weeks ago.  Reports having a flushed feeling.  Reports pain is burning and took tums.  ?

## 2021-05-15 ENCOUNTER — Observation Stay (HOSPITAL_BASED_OUTPATIENT_CLINIC_OR_DEPARTMENT_OTHER): Payer: Medicare PPO

## 2021-05-15 ENCOUNTER — Emergency Department (HOSPITAL_COMMUNITY): Payer: Medicare PPO

## 2021-05-15 ENCOUNTER — Observation Stay (HOSPITAL_COMMUNITY): Payer: Medicare PPO

## 2021-05-15 DIAGNOSIS — R140 Abdominal distension (gaseous): Secondary | ICD-10-CM | POA: Diagnosis not present

## 2021-05-15 DIAGNOSIS — N1831 Chronic kidney disease, stage 3a: Secondary | ICD-10-CM

## 2021-05-15 DIAGNOSIS — I2510 Atherosclerotic heart disease of native coronary artery without angina pectoris: Secondary | ICD-10-CM | POA: Diagnosis not present

## 2021-05-15 DIAGNOSIS — R0789 Other chest pain: Secondary | ICD-10-CM | POA: Diagnosis not present

## 2021-05-15 DIAGNOSIS — E7800 Pure hypercholesterolemia, unspecified: Secondary | ICD-10-CM | POA: Diagnosis not present

## 2021-05-15 DIAGNOSIS — R079 Chest pain, unspecified: Secondary | ICD-10-CM

## 2021-05-15 DIAGNOSIS — I10 Essential (primary) hypertension: Secondary | ICD-10-CM | POA: Diagnosis not present

## 2021-05-15 DIAGNOSIS — I2583 Coronary atherosclerosis due to lipid rich plaque: Secondary | ICD-10-CM

## 2021-05-15 LAB — COMPREHENSIVE METABOLIC PANEL
ALT: 22 U/L (ref 0–44)
AST: 18 U/L (ref 15–41)
Albumin: 4 g/dL (ref 3.5–5.0)
Alkaline Phosphatase: 89 U/L (ref 38–126)
Anion gap: 7 (ref 5–15)
BUN: 28 mg/dL — ABNORMAL HIGH (ref 8–23)
CO2: 26 mmol/L (ref 22–32)
Calcium: 9.4 mg/dL (ref 8.9–10.3)
Chloride: 105 mmol/L (ref 98–111)
Creatinine, Ser: 1.35 mg/dL — ABNORMAL HIGH (ref 0.61–1.24)
GFR, Estimated: 55 mL/min — ABNORMAL LOW (ref >60)
Glucose, Bld: 110 mg/dL — ABNORMAL HIGH (ref 70–99)
Potassium: 4 mmol/L (ref 3.5–5.1)
Sodium: 138 mmol/L (ref 135–145)
Total Bilirubin: 0.5 mg/dL (ref 0.3–1.2)
Total Protein: 7.5 g/dL (ref 6.5–8.1)

## 2021-05-15 LAB — ECHOCARDIOGRAM COMPLETE
AR max vel: 2.08 cm2
AV Area VTI: 2.47 cm2
AV Area mean vel: 1.95 cm2
AV Mean grad: 4 mmHg
AV Peak grad: 6.9 mmHg
Ao pk vel: 1.31 m/s
Area-P 1/2: 2.6 cm2
Calc EF: 49.7 %
Height: 70 in
MV VTI: 2.73 cm2
S' Lateral: 3 cm
Single Plane A2C EF: 45.7 %
Single Plane A4C EF: 55.7 %
Weight: 3887.15 oz

## 2021-05-15 LAB — CBC
HCT: 41.3 % (ref 39.0–52.0)
Hemoglobin: 13.3 g/dL (ref 13.0–17.0)
MCH: 29.4 pg (ref 26.0–34.0)
MCHC: 32.2 g/dL (ref 30.0–36.0)
MCV: 91.4 fL (ref 80.0–100.0)
Platelets: 270 10*3/uL (ref 150–400)
RBC: 4.52 MIL/uL (ref 4.22–5.81)
RDW: 13.5 % (ref 11.5–15.5)
WBC: 10.7 10*3/uL — ABNORMAL HIGH (ref 4.0–10.5)
nRBC: 0 % (ref 0.0–0.2)

## 2021-05-15 LAB — TROPONIN I (HIGH SENSITIVITY)
Troponin I (High Sensitivity): 4 ng/L (ref <18)
Troponin I (High Sensitivity): 4 ng/L (ref <18)
Troponin I (High Sensitivity): 5 ng/L (ref <18)

## 2021-05-15 LAB — MRSA NEXT GEN BY PCR, NASAL: MRSA by PCR Next Gen: NOT DETECTED

## 2021-05-15 LAB — LIPASE, BLOOD: Lipase: 92 U/L — ABNORMAL HIGH (ref 11–51)

## 2021-05-15 MED ORDER — ENOXAPARIN SODIUM 60 MG/0.6ML IJ SOSY
60.0000 mg | PREFILLED_SYRINGE | INTRAMUSCULAR | Status: DC
  Administered 2021-05-15 – 2021-05-16 (×2): 60 mg via SUBCUTANEOUS
  Filled 2021-05-15 (×2): qty 0.6

## 2021-05-15 MED ORDER — HYDROMORPHONE HCL 1 MG/ML IJ SOLN
1.0000 mg | Freq: Once | INTRAMUSCULAR | Status: AC
  Administered 2021-05-15: 1 mg via INTRAVENOUS
  Filled 2021-05-15: qty 1

## 2021-05-15 MED ORDER — TAMSULOSIN HCL 0.4 MG PO CAPS
0.4000 mg | ORAL_CAPSULE | Freq: Every day | ORAL | Status: DC
  Administered 2021-05-15 – 2021-05-16 (×2): 0.4 mg via ORAL
  Filled 2021-05-15 (×2): qty 1

## 2021-05-15 MED ORDER — PRAVASTATIN SODIUM 10 MG PO TABS
10.0000 mg | ORAL_TABLET | ORAL | Status: DC
  Administered 2021-05-15: 10 mg via ORAL
  Filled 2021-05-15: qty 1

## 2021-05-15 MED ORDER — METHOCARBAMOL 500 MG PO TABS
750.0000 mg | ORAL_TABLET | Freq: Three times a day (TID) | ORAL | Status: DC | PRN
  Administered 2021-05-16: 750 mg via ORAL
  Filled 2021-05-15: qty 2

## 2021-05-15 MED ORDER — ACETAMINOPHEN 650 MG RE SUPP: 650.0000 mg | Freq: Four times a day (QID) | RECTAL | Status: DC | PRN

## 2021-05-15 MED ORDER — CELECOXIB 100 MG PO CAPS
200.0000 mg | ORAL_CAPSULE | Freq: Two times a day (BID) | ORAL | Status: DC
  Administered 2021-05-15 – 2021-05-16 (×3): 200 mg via ORAL
  Filled 2021-05-15 (×5): qty 1
  Filled 2021-05-15: qty 12
  Filled 2021-05-15 (×2): qty 1
  Filled 2021-05-15: qty 2

## 2021-05-15 MED ORDER — SODIUM CHLORIDE 0.9% FLUSH: 3.0000 mL | INTRAVENOUS | Status: DC | PRN

## 2021-05-15 MED ORDER — SODIUM CHLORIDE 0.9 % IV SOLN: 250.0000 mL | INTRAVENOUS | Status: DC | PRN

## 2021-05-15 MED ORDER — IOHEXOL 350 MG/ML SOLN
100.0000 mL | Freq: Once | INTRAVENOUS | Status: AC | PRN
  Administered 2021-05-15: 100 mL via INTRAVENOUS

## 2021-05-15 MED ORDER — IRBESARTAN 150 MG PO TABS
300.0000 mg | ORAL_TABLET | Freq: Every day | ORAL | Status: DC
  Administered 2021-05-16: 300 mg via ORAL
  Filled 2021-05-15: qty 2

## 2021-05-15 MED ORDER — ACETAMINOPHEN 325 MG PO TABS
650.0000 mg | ORAL_TABLET | Freq: Four times a day (QID) | ORAL | Status: DC | PRN
Start: 2021-05-15 — End: 2021-05-16
  Administered 2021-05-16: 650 mg via ORAL
  Filled 2021-05-15: qty 2

## 2021-05-15 MED ORDER — NITROGLYCERIN IN D5W 200-5 MCG/ML-% IV SOLN
5.0000 ug/min | INTRAVENOUS | Status: DC
  Administered 2021-05-15: 5 ug/min via INTRAVENOUS
  Filled 2021-05-15: qty 250

## 2021-05-15 MED ORDER — ONDANSETRON HCL 4 MG/2ML IJ SOLN: 4.0000 mg | Freq: Four times a day (QID) | INTRAMUSCULAR | Status: DC | PRN

## 2021-05-15 MED ORDER — ONDANSETRON HCL 4 MG PO TABS: 4.0000 mg | ORAL_TABLET | Freq: Four times a day (QID) | ORAL | Status: DC | PRN

## 2021-05-15 MED ORDER — CHLORHEXIDINE GLUCONATE CLOTH 2 % EX PADS
6.0000 | MEDICATED_PAD | Freq: Every day | CUTANEOUS | Status: DC
  Administered 2021-05-15: 6 via TOPICAL

## 2021-05-15 MED ORDER — SODIUM CHLORIDE 0.9% FLUSH
3.0000 mL | Freq: Two times a day (BID) | INTRAVENOUS | Status: DC
  Administered 2021-05-15 – 2021-05-16 (×2): 3 mL via INTRAVENOUS

## 2021-05-15 MED ORDER — FENTANYL CITRATE PF 50 MCG/ML IJ SOSY
50.0000 ug | PREFILLED_SYRINGE | Freq: Once | INTRAMUSCULAR | Status: AC
  Administered 2021-05-15: 50 ug via INTRAVENOUS
  Filled 2021-05-15: qty 1

## 2021-05-15 NOTE — ED Provider Notes (Signed)
?[facility] EMERGENCY DEPARTMENT ?Provider Note ? ? ?CSN: [id number] ?Arrival date & time: 05/14/21  2259 ? ?  ? ?History ? ?Chief Complaint  ?Patient presents with  ? Chest Pain  ? ? ?Johnny Johns is a 71 y.o. male. ? ?71 year old male with past medical history of myocardial infarction status post DES to the LAD who presents to the ER today secondary to retrosternal chest pain.  Patient was at rest and had a cute onset of symptoms.  Patient states it feels like pressure does not really radiate.  He has had some intermittent nausea and diaphoresis with it.  Presents here for further evaluation.  Tried multiple Tums which did not seem to help.  Has been compliant with his medications but does not take any type of antiplatelets ? ? ?Chest Pain ? ?  ? ?Home Medications ?Prior to Admission medications   ?Medication Sig Start Date End Date Taking? Authorizing Provider  ?metaxalone (SKELAXIN) 800 MG tablet Take 800 mg by mouth 2 (two) times daily as needed. 01/19/21   [provider]  ?tamsulosin (FLOMAX) 0.4 MG CAPS capsule Take 1 capsule (0.4 mg total) by mouth daily. 03/16/21   Stoneking, Danford Bad., MD  ?valsartan (DIOVAN) 320 MG tablet Take 320 mg by mouth at bedtime. 12/15/20   [provider]  ?   ? ?Allergies    ?Gemfibrozil, Metoprolol, Simvastatin, Statins, and Vioxx [rofecoxib]   ? ?Review of Systems   ?Review of Systems  ?Cardiovascular:  Positive for chest pain.  ? ?Physical Exam ?Updated Vital Signs ?BP 102/66   Pulse 66   Temp 97.8 ?F (36.6 ?C) (Oral)   Resp 15   Ht 5\' 11"  (1.803 m)   Wt 108 kg   SpO2 90%   BMI 33.19 kg/m?  ?Physical Exam ?Vitals and nursing note reviewed.  ?Constitutional:   ?   Appearance: He is well-developed.  ?HENT:  ?   Head: Normocephalic and atraumatic.  ?Eyes:  ?   Pupils: Pupils are equal, round, and reactive to light.  ?Cardiovascular:  ?   Rate and Rhythm: Normal rate.  ?Pulmonary:  ?   Effort: Pulmonary effort is normal. No respiratory distress.  ?Abdominal:   ?   General: There is no distension or abdominal bruit.  ?   Palpations: Abdomen is soft.  ?Musculoskeletal:     ?   General: Normal range of motion.  ?   Cervical back: Normal range of motion.  ?Skin: ?   General: Skin is warm and dry.  ?Neurological:  ?   Mental Status: He is alert.  ? ? ?ED Results / Procedures / Treatments   ?Labs ?(all labs ordered are listed, but only abnormal results are displayed) ?Labs Reviewed  ?CBC - Abnormal; Notable for the following components:  ?    Result Value  ? WBC 10.7 (*)   ? All other components within normal limits  ?COMPREHENSIVE METABOLIC PANEL - Abnormal; Notable for the following components:  ? Glucose, Bld 110 (*)   ? BUN 28 (*)   ? Creatinine, Ser 1.35 (*)   ? GFR, Estimated 55 (*)   ? All other components within normal limits  ?LIPASE, BLOOD - Abnormal; Notable for the following components:  ? Lipase 92 (*)   ? All other components within normal limits  ?CBG MONITORING, ED  ?TROPONIN I (HIGH SENSITIVITY)  ?TROPONIN I (HIGH SENSITIVITY)  ? ? ?EKG ?EKG Interpretation ? ?Date/Time:  Thursday May 14 2021 23:25:13 EDT ?Ventricular Rate:  83 ?PR Interval:  157 ?QRS Duration: 84 ?QT Interval:  357 ?QTC Calculation: 420 ?R Axis:   61 ?Text Interpretation: Sinus rhythm Ventricular premature complex Probable anterior infarct, old Confirmed by Marily MemosMesner, Livi Mcgann [phone]([id number]) on 05/14/2021 11:28:25 PM ? ?Radiology ?DG Chest 2 View ? ?Result Date: 05/15/2021 ?CLINICAL DATA:  Chest pain for 2 hours with nausea, initial encounter EXAM: CHEST - 2 VIEW COMPARISON:  04/09/2004 FINDINGS: The heart size and mediastinal contours are within normal limits. Both lungs are clear. The visualized skeletal structures are unremarkable. IMPRESSION: No active cardiopulmonary disease. Electronically Signed   By: Alcide CleverMark  Lukens M.D.   On: 05/15/2021 00:12  ? ?CT Angio Chest/Abd/Pel for Dissection W and/or Wo Contrast ? ?Result Date: 05/15/2021 ?CLINICAL DATA:  Acute aortic syndrome suspected. EXAM: CT ANGIOGRAPHY  CHEST, ABDOMEN AND PELVIS TECHNIQUE: Non-contrast CT of the chest was initially obtained. Multidetector CT imaging through the chest, abdomen and pelvis was performed using the standard protocol during bolus administration of intravenous contrast. Multiplanar reconstructed images and MIPs were obtained and reviewed to evaluate the vascular anatomy. RADIATION DOSE REDUCTION: This exam was performed according to the departmental dose-optimization program which includes automated exposure control, adjustment of the mA and/or kV according to patient size and/or use of iterative reconstruction technique. CONTRAST:  100mL OMNIPAQUE IOHEXOL 350 MG/ML SOLN COMPARISON:  CT abdomen pelvis dated 02/02/2021. Chest radiograph dated 05/15/2021. FINDINGS: CTA CHEST FINDINGS Cardiovascular: There is no cardiomegaly or pericardial effusion. There is coronary vascular calcification of the LAD. The thoracic aorta is unremarkable. The origins of the great vessels of the aortic arch appear patent. No pulmonary artery embolus identified. Mediastinum/Nodes: No hilar or mediastinal adenopathy. The esophagus is grossly unremarkable. No mediastinal fluid collection. Lungs/Pleura: Mild centrilobular emphysema. No focal consolidation, pleural effusion, pneumothorax. The central airways are patent. Musculoskeletal: No acute osseous pathology. Review of the MIP images confirms the above findings. CTA ABDOMEN AND PELVIS FINDINGS VASCULAR Aorta: Normal caliber aorta without aneurysm, dissection, vasculitis or significant stenosis. Mild atherosclerotic calcification. Celiac: Patent without evidence of aneurysm, dissection, vasculitis or significant stenosis. SMA: Patent without evidence of aneurysm, dissection, vasculitis or significant stenosis. Renals: Both renal arteries are patent without evidence of aneurysm, dissection, vasculitis, fibromuscular dysplasia or significant stenosis. IMA: Patent without evidence of aneurysm, dissection,  vasculitis or significant stenosis. Inflow: Patent without evidence of aneurysm, dissection, vasculitis or significant stenosis. Veins: No obvious venous abnormality within the limitations of this arterial phase study. Review of the MIP images confirms the above findings. NON-VASCULAR No intra-abdominal free air or free fluid. Hepatobiliary: Apparent mild fatty liver. No intrahepatic biliary ductal dilatation. There is a stone in the neck of the gallbladder. The gallbladder is distended. No pericholecystic fluid or evidence of acute cholecystitis by CT. Ultrasound may provide better evaluation if there is clinical concern for acute cholecystitis. Pancreas: Unremarkable. No pancreatic ductal dilatation or surrounding inflammatory changes. Spleen: Normal in size without focal abnormality. Adrenals/Urinary Tract: The adrenal glands unremarkable. There is a 6 mm distal right ureteral calculus. This may correspond to the stone seen in the proximal right ureter on the prior CT. There is no hydronephrosis. The left kidney, left ureter, and urinary bladder appear unremarkable. Stomach/Bowel: There is sigmoid diverticulosis and scattered colonic diverticula without active inflammatory changes. There is no bowel obstruction or active inflammation. The appendix is normal. Lymphatic: No adenopathy. Reproductive: Enlarged prostate gland measuring 5 cm in transverse axial diameter. Other: None Musculoskeletal: Degenerative changes at L5-S1. No acute osseous pathology. Review of the MIP images confirms  the above findings. IMPRESSION: 1. No acute intrathoracic pathology. No aortic aneurysm or dissection. 2. A 6 mm distal right ureteral calculus, possibly corresponding to the stone seen in the proximal right ureter on the prior CT. There is no hydronephrosis. 3. Cholelithiasis. No evidence of acute cholecystitis by CT. Ultrasound may provide better evaluation if there is clinical concern for acute cholecystitis. 4. Colonic  diverticulosis. No bowel obstruction. Normal appendix. Electronically Signed   By: Elgie Collard M.D.   On: 05/15/2021 01:39   ? ?Procedures ?Procedures  ? ? ?Medications Ordered in ED ?Medications  ?aspirin EC tablet 324 mg (

## 2021-05-15 NOTE — ED Notes (Signed)
Pt alert, NAD, calm, interactive, resps e/u, speaking in clear complete sentences. [place] at [facility]. Report given to Primary receiving RN ICU. ?

## 2021-05-15 NOTE — Progress Notes (Signed)
*  PRELIMINARY RESULTS* ?Echocardiogram ?2D Echocardiogram has been performed. ? ?Johnny Johns ?05/15/2021, 9:29 AM ?

## 2021-05-15 NOTE — ED Notes (Signed)
Patient transported to CT 

## 2021-05-15 NOTE — H&P (Signed)
?History and Physical  ? ? ?Johnny GladeGordon E Hockley ZOX:[id number] DOB: 17-Jun-1950 DOA: 05/14/2021 ? ?PCP: Carylon PerchesFagan, Roy, MD  ?Patient coming from: Home ? ?Chief Complaint: Chest pain  ? ?HPI: Johnny Johns is a 71 y.o. male with medical history significant of coronary artery disease, chronic neck pain and cervical spinal stenosis, hyperlipidemia, hypertension, irritable bowel syndrome who presents to the hospital with complaints of chest pain which started last night around 9 PM.  He states that he was in his regular state of health when he started to have central chest pain 10 out of 10 in severity, burning and gripping.  Chest pain lasted about 6 hours.  Finally resolved in the emergency department around 3 AM.  Currently, patient is in stepdown unit and having similar pain 2 out of 10 in severity.  He admits to diaphoresis and flushing last night at onset of chest pain.  Denies any radiating pain to his back or his arms.  No recent illnesses including fevers, cough, diarrhea. ? ?ED Course: Troponin remained negative, EKG reassuring, CTA chest abdomen pelvis negative for intrathoracic pathology, no aortic aneurysm or dissection.  Did show right ureteral calculus, no hydronephrosis, cholelithiasis, diverticulosis.  Patient admitted and was started on nitro drip, cardiology consulted. ? ?Review of Systems: As per HPI. Otherwise, all other review of systems reviewed and are negative.  ? ?Past Medical History:  ?Diagnosis Date  ? 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 Presented with acute myocardial infarction In 01/2003; isolated 90% proximal LAD lesion treated with DES; EF 45-50%  ? Degenerative joint disease   ? Chronic neck and back pain  ? Hyperlipidemia   ? lipid profile in 06/2010:195, 207, 37, 117  ? Hypertension   ? IBS (irritable bowel syndrome)   ? Myocardial infarction [facility](HCC)   ? PUD (peptic ulcer disease)   ? hx  ? Tobacco abuse, in remission   ? ? ?Past Surgical History:  ?Procedure Laterality  Date  ? CARDIAC CATHETERIZATION    ? stent-2005  ? COLONOSCOPY  01/11/2006  ? CYSTOSCOPY W/ URETERAL STENT PLACEMENT Right 02/06/2021  ? Procedure: CYSTOSCOPY WITH RIGHT RETROGRADE PYELOGRAM/ RIGHT URETERAL STENT PLACEMENT/ RIGHT URETEROSCOPY;  Surgeon: Milderd MeagerStoneking, Bradley J., MD;  Location: [facility];  Service: Urology;  Laterality: Right;  ? EXTRACORPOREAL SHOCK WAVE LITHOTRIPSY Right 02/10/2021  ? Procedure: EXTRACORPOREAL SHOCK WAVE LITHOTRIPSY (ESWL);  Surgeon: Milderd MeagerStoneking, Bradley J., MD;  Location: [facility];  Service: Urology;  Laterality: Right;  ? KIDNEY STONE SURGERY  01/12/1988  ? ? ? reports that he quit smoking about 18 years ago. His smoking use included cigarettes. He has a 30.00 pack-year smoking history. He has never used smokeless tobacco. He reports that he does not drink alcohol and does not use drugs. ? ?Allergies  ?Allergen Reactions  ? Gemfibrozil   ? Metoprolol Other (See Comments)  ?  Myalgias and neck pain  ? Simvastatin Other (See Comments)  ?  Myalgias; also Vytorin, pravastatin and Lipitor  ? Statins Other (See Comments)  ?  Muscle aches  ? Vioxx [Rofecoxib]   ? ? ?History reviewed. No pertinent family history. ? ?Prior to Admission medications   ?Medication Sig Start Date End Date Taking? Authorizing Provider  ?celecoxib (CELEBREX) 200 MG capsule Take 200 mg by mouth 2 (two) times daily. 05/11/21  Yes [provider]  ?methocarbamol (ROBAXIN) 750 MG tablet Take 750 mg by mouth 3 (three) times daily as needed. 05/11/21  Yes [provider]  ?pravastatin (PRAVACHOL) 10 MG  tablet Take 10 mg by mouth once a week. 03/23/21  Yes [provider]  ?tamsulosin (FLOMAX) 0.4 MG CAPS capsule Take 1 capsule (0.4 mg total) by mouth daily. 03/16/21  Yes Stoneking, Danford Bad., MD  ?valsartan (DIOVAN) 320 MG tablet Take 320 mg by mouth at bedtime. 12/15/20  Yes [provider]  ? ? ?Physical Exam: ?Vitals:  ? 05/15/21 5284 05/15/21 0700 05/15/21 0801 05/15/21 0803  ?BP: 117/72 111/74   103/80  ?Pulse: 84 83 93 82  ?Resp: 20 20 10 20   ?Temp:   97.6 ?F (36.4 ?C)   ?TempSrc:   Oral   ?SpO2: 96% 96% 98% 96%  ?Weight:   110.2 kg   ?Height:   5\' 10"  (1.778 m)   ? ? ? ?Constitutional: NAD, calm, comfortable ?Eyes: PERRL, lids and conjunctivae normal ?ENMT: Mucous membranes are moist. Normal dentition.  ?Respiratory: Clear to auscultation bilaterally, no wheezing, no crackles. Normal respiratory effort. No accessory muscle use. No conversational dyspnea  ?Cardiovascular: Regular rate and rhythm, no murmurs. No extremity edema.  ?Abdomen: Soft, nondistended, nontender to palpation. Bowel sounds positive.  ?Musculoskeletal: No joint deformity upper and lower extremities. No contractures. Normal muscle tone.  ?Skin: no rashes, lesions, ulcers on exposed skin  ?Neurologic: Alert and oriented, speech fluent, CN 2-12 grossly intact. No focal deficits.   ?Psychiatric: Normal judgment and insight. Normal mood and affect  ? ?Labs on Admission: I have personally reviewed following labs and imaging studies ? ?CBC: ?Recent Labs  ?Lab 05/14/21 ?2343  ?WBC 10.7*  ?HGB 13.3  ?HCT 41.3  ?MCV 91.4  ?PLT 270  ? ?Basic Metabolic Panel: ?Recent Labs  ?Lab 05/14/21 ?2343  ?NA 138  ?K 4.0  ?CL 105  ?CO2 26  ?GLUCOSE 110*  ?BUN 28*  ?CREATININE 1.35*  ?CALCIUM 9.4  ? ?GFR: ?Estimated Creatinine Clearance: 60.6 mL/min (A) (by C-G formula based on SCr of 1.35 mg/dL (H)). ?Liver Function Tests: ?Recent Labs  ?Lab 05/14/21 ?2343  ?AST 18  ?ALT 22  ?ALKPHOS 89  ?BILITOT 0.5  ?PROT 7.5  ?ALBUMIN 4.0  ? ?Recent Labs  ?Lab 05/14/21 ?2343  ?LIPASE 92*  ? ?No results for input(s): AMMONIA in the last 168 hours. ?Coagulation Profile: ?No results for input(s): INR, PROTIME in the last 168 hours. ?Cardiac Enzymes: ?No results for input(s): CKTOTAL, CKMB, CKMBINDEX, TROPONINI in the last 168 hours. ?BNP (last 3 results) ?No results for input(s): PROBNP in the last 8760 hours. ?HbA1C: ?No results for input(s): HGBA1C in the last 72  hours. ?CBG: ?No results for input(s): GLUCAP in the last 168 hours. ?Lipid Profile: ?No results for input(s): CHOL, HDL, LDLCALC, TRIG, CHOLHDL, LDLDIRECT in the last 72 hours. ?Thyroid Function Tests: ?No results for input(s): TSH, T4TOTAL, FREET4, T3FREE, THYROIDAB in the last 72 hours. ?Anemia Panel: ?No results for input(s): VITAMINB12, FOLATE, FERRITIN, TIBC, IRON, RETICCTPCT in the last 72 hours. ?Urine analysis: ?   ?Component Value Date/Time  ? COLORURINE YELLOW 02/07/2021 0315  ? APPEARANCEUR Clear 04/13/2021 1003  ? LABSPEC 1.025 02/07/2021 0315  ? PHURINE 6.5 02/07/2021 0315  ? GLUCOSEU Negative 04/13/2021 1003  ? HGBUR LARGE (A) 02/07/2021 0315  ? BILIRUBINUR Negative 04/13/2021 1003  ? KETONESUR NEGATIVE 02/07/2021 0315  ? PROTEINUR Trace (A) 04/13/2021 1003  ? PROTEINUR >300 (A) 02/07/2021 0315  ? NITRITE Negative 04/13/2021 1003  ? NITRITE POSITIVE (A) 02/07/2021 0315  ? LEUKOCYTESUR Trace (A) 04/13/2021 1003  ? LEUKOCYTESUR SMALL (A) 02/07/2021 0315  ? ?Sepsis Labs: !!!!!!!!!!!!!!!!!!!!!!!!!!!!!!!!!!!!!!!!!!!! ?@LABRCNTIP (procalcitonin:4,lacticidven:4) ?)  No results found for this or any previous visit (from the past 240 hour(s)).  ? ?Radiological Exams on Admission: ?DG Chest 2 View ? ?Result Date: 05/15/2021 ?CLINICAL DATA:  Chest pain for 2 hours with nausea, initial encounter EXAM: CHEST - 2 VIEW COMPARISON:  04/09/2004 FINDINGS: The heart size and mediastinal contours are within normal limits. Both lungs are clear. The visualized skeletal structures are unremarkable. IMPRESSION: No active cardiopulmonary disease. Electronically Signed   By: Alcide Clever M.D.   On: 05/15/2021 00:12  ? ?US Abdomen Limited ? ?Result Date: 05/15/2021 ?CLINICAL DATA:  71 year old male with suspected cholelithiasis on CT. EXAM: ULTRASOUND ABDOMEN LIMITED RIGHT UPPER QUADRANT COMPARISON:  CT Chest, Abdomen, and Pelvis 0110 hours today. FINDINGS: Gallbladder: Gallbladder distension similar to the earlier CT. Mild wall  thickening up to of between 4 to 6 mm (image 17). No cholelithiasis or sludge identified. There is trace pericholecystic fluid (image 41). Common bile duct: Diameter: 4-5 mm, within normal limits. Liver: Increased live

## 2021-05-15 NOTE — Consult Note (Signed)
?Cardiology Consultation:  ? ?Patient ID: Johnny Johns ?MRN: [id number]; DOB: [birth year] ? ?Admit date: 05/14/2021 ?Date of Consult: 05/15/2021 ? ?Primary Care Provider: Carylon Perches, MD ?[facility] Cardiologist: None (saw Isaias Cowman in the past) ?[facility] Electrophysiologist:  None  ? ? ?Patient Profile:  ? ?Johnny Johns is a 71 y.o. male with a hx of ASCAD, hyperlipidemia, hypertension, peptic ulcer disease and remote tobacco use who is being seen today for the evaluation of chest pain at the request of Noralee Stain, MD. ? ?History of Present Illness:  ? ?[name] is a 71 year old male with a history of ASCHD with remote MI in 2005 at which time he was found to have a 90% proximal LAD lesion treated with DES.  EF at that time was 45 to 50%.  He was followed by Dr. Loetta Rough but was lost to follow-up.  He also has a history of hyperlipidemia, hypertension, peptic ulcer disease and remote tobacco use. ? ?He was in his usual state of health until a few weeks ago when he had cervical spine surgery for chronic neck pain and cervical spinal stenosis.  He had been doing well from surgery and had sudden onset of chest pain starting last night.  He said this was a few hours after he had eaten a salad.  He developed central chest pain 10 out of 10 in severity with burning and squeezing.  He thought it was indigestion and drank some soda which he thinks might have helped a little.  The chest only lasted 6 hours in duration.  It resolved in the emergency room around 3 AM and he is not really sure what made it resolved he said it just went away.  He had taken sublingual nitroglycerin for it which did no good.  He says he did feel little flushed at the onset of the pain but there is no radiation into his neck, arms or back, no associated nausea or shortness of breath.  In the ER troponin was negative and EKG showed no acute changes.  CT of the chest and abdomen showed no evidence aortic aneurysm or dissection  and no PE.  He was found to have cholelithiasis as well as diverticulosis and ureteral calculus.  He was admitted to the ICU and started on IV nitro drip.  Cardiology is now asked to consult. ? ?He remains pain-free.  He tells me he has not had any heart problems in years.  He does have a history of peptic ulcer disease.  Labs are significant for creatinine 1.35 BUN 28 potassium 4.  Lipase is mildly elevated at 92 and AST and ALT are normal .  WBC was mildly elevated at 10.7.  Renal ultrasound showed mild right hydronephrosis.  Abdominal ultrasound demonstrated a distended gallbladder with mild wall thickening and pericholecystic fluid with no gallstones.  There is concern that this may represent a calculus cholecystitis. ? ? ? ?Past Medical History:  ?Diagnosis Date  ? 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 Presented with acute myocardial infarction In 01/2003; isolated 90% proximal LAD lesion treated with DES; EF 45-50%  ? Degenerative joint disease   ? Chronic neck and back pain  ? Hyperlipidemia   ? lipid profile in 06/2010:195, 207, 37, 117  ? Hypertension   ? IBS (irritable bowel syndrome)   ? Myocardial infarction [facility])   ? PUD (peptic ulcer disease)   ? hx  ? Tobacco abuse, in remission   ? ? ?Past Surgical History:  ?  Procedure Laterality Date  ? CARDIAC CATHETERIZATION    ? stent-2005  ? COLONOSCOPY  01/11/2006  ? CYSTOSCOPY W/ URETERAL STENT PLACEMENT Right 02/06/2021  ? Procedure: CYSTOSCOPY WITH RIGHT RETROGRADE PYELOGRAM/ RIGHT URETERAL STENT PLACEMENT/ RIGHT URETEROSCOPY;  Surgeon: Milderd MeagerStoneking, Bradley J., MD;  Location: [facility];  Service: Urology;  Laterality: Right;  ? EXTRACORPOREAL SHOCK WAVE LITHOTRIPSY Right 02/10/2021  ? Procedure: EXTRACORPOREAL SHOCK WAVE LITHOTRIPSY (ESWL);  Surgeon: Milderd MeagerStoneking, Bradley J., MD;  Location: [facility];  Service: Urology;  Laterality: Right;  ? KIDNEY STONE SURGERY  01/12/1988  ?  ? ?Home Medications:  ?Prior to Admission medications   ?Medication Sig  Start Date End Date Taking? Authorizing Provider  ?celecoxib (CELEBREX) 200 MG capsule Take 200 mg by mouth 2 (two) times daily. 05/11/21  Yes [provider]  ?methocarbamol (ROBAXIN) 750 MG tablet Take 750 mg by mouth 3 (three) times daily as needed. 05/11/21  Yes [provider]  ?pravastatin (PRAVACHOL) 10 MG tablet Take 10 mg by mouth once a week. 03/23/21  Yes [provider]  ?tamsulosin (FLOMAX) 0.4 MG CAPS capsule Take 1 capsule (0.4 mg total) by mouth daily. 03/16/21  Yes Stoneking, Danford BadBradley J., MD  ?valsartan (DIOVAN) 320 MG tablet Take 320 mg by mouth at bedtime. 12/15/20  Yes [provider]  ? ? ?Inpatient Medications: ?Scheduled Meds: ? aspirin EC  324 mg Oral Daily  ? celecoxib  200 mg Oral BID  ? Chlorhexidine Gluconate Cloth  6 each Topical Q0600  ? enoxaparin (LOVENOX) injection  60 mg Subcutaneous Q24H  ? irbesartan  300 mg Oral Daily  ? pravastatin  10 mg Oral Weekly  ? sodium chloride flush  3 mL Intravenous Q12H  ? sodium chloride flush  3 mL Intravenous Q12H  ? tamsulosin  0.4 mg Oral Daily  ? ?Continuous Infusions: ? sodium chloride    ? nitroGLYCERIN Stopped (05/15/21 1524)  ? ?PRN Meds: ?sodium chloride, acetaminophen **OR** acetaminophen, methocarbamol, ondansetron **OR** ondansetron (ZOFRAN) IV, sodium chloride flush ? ?Allergies:    ?Allergies  ?Allergen Reactions  ? Gemfibrozil   ? Metoprolol Other (See Comments)  ?  Myalgias and neck pain  ? Simvastatin Other (See Comments)  ?  Myalgias; also Vytorin, pravastatin and Lipitor  ? Statins Other (See Comments)  ?  Muscle aches  ? Vioxx [Rofecoxib]   ? ? ?Social History:   ?Social History  ? ?Socioeconomic History  ? Marital status: Married  ?  Spouse name: Not on file  ? Number of children: Not on file  ? Years of education: Not on file  ? Highest education level: Not on file  ?Occupational History  ? Not on file  ?Tobacco Use  ? Smoking status: Former  ?  Packs/day: 1.50  ?  Years: 20.00  ?  Pack years: 30.00   ?  Types: Cigarettes  ?  Quit date: 01/18/2003  ?  Years since quitting: 18.3  ? Smokeless tobacco: Never  ?Vaping Use  ? Vaping Use: Never used  ?Substance and Sexual Activity  ? Alcohol use: No  ? Drug use: No  ? Sexual activity: Yes  ?Other Topics Concern  ? Not on file  ?Social History Narrative  ? Married; full time; gets regular exercise.   ? ?Social Determinants of Health  ? ?Financial Resource Strain: Not on file  ?Food Insecurity: Not on file  ?Transportation Needs: Not on file  ?Physical Activity: Not on file  ?Stress: Not on file  ?Social Connections: Not on  file  ?Intimate Partner Violence: Not on file  ?  ?Family History:   ?History reviewed. No pertinent family history.  ? ?ROS:  ?Please see the history of present illness.  ? ?All other ROS reviewed and negative.    ? ?Physical Exam/Data:  ? ?Vitals:  ? 05/15/21 1300 05/15/21 1400 05/15/21 1430 05/15/21 1500  ?BP: 100/62 117/67 108/66 110/70  ?Pulse: 80 95 89 87  ?Resp: 17 (!) 23 16 20   ?Temp:    98.3 ?F (36.8 ?C)  ?TempSrc:    Oral  ?SpO2: 95% 97% 94% 97%  ?Weight:      ?Height:      ? ? ?Intake/Output Summary (Last 24 hours) at 05/15/2021 1736 ?Last data filed at 05/15/2021 1528 ?Gross per 24 hour  ?Intake 285.93 ml  ?Output --  ?Net 285.93 ml  ? ? ?  05/15/2021  ?  8:01 AM 05/14/2021  ? 11:18 PM 04/13/2021  ?  9:34 AM  ?Last 3 Weights  ?Weight (lbs) 242 lb 15.2 oz 238 lb 230 lb  ?Weight (kg) 110.2 kg 107.956 kg 104.327 kg  ?   ?Body mass index is 34.86 kg/m?.  ?General:  Well nourished, well developed, in no acute distress ?HEENT: normal ?Lymph: no adenopathy ?Neck: no JVD ?Endocrine:  No thryomegaly ?Vascular: No carotid bruits; FA pulses 2+ bilaterally without bruits  ?Cardiac:  normal S1, S2; RRR; no murmur  ?Lungs:  clear to auscultation bilaterally, no wheezing, rhonchi or rales  ?Abd: soft, nontender, no hepatomegaly  ?Ext: no edema ?Musculoskeletal:  No deformities, BUE and BLE strength normal and equal ?Skin: warm and dry  ?Neuro:  CNs 2-12 intact, no  focal abnormalities noted ?Psych:  Normal affect  ? ?EKG:  The EKG was personally reviewed and demonstrates: Normal sinus rhythm with old anterior infarct ?Telemetry:  Telemetry was personally reviewed

## 2021-05-15 NOTE — ED Notes (Signed)
[place] finished at [facility]. ICU team here to transport pt. ?

## 2021-05-16 ENCOUNTER — Inpatient Hospital Stay (HOSPITAL_COMMUNITY)
Admission: EM | Admit: 2021-05-16 | Discharge: 2021-05-19 | DRG: 854 | Disposition: A | Discharge status: Home / Self Care | Payer: Medicare PPO | Attending: Internal Medicine | Admitting: Internal Medicine

## 2021-05-16 ENCOUNTER — Other Ambulatory Visit: Payer: Self-pay

## 2021-05-16 ENCOUNTER — Encounter (HOSPITAL_COMMUNITY): Payer: Self-pay

## 2021-05-16 ENCOUNTER — Emergency Department (HOSPITAL_COMMUNITY): Payer: Medicare PPO

## 2021-05-16 DIAGNOSIS — K219 Gastro-esophageal reflux disease without esophagitis: Secondary | ICD-10-CM | POA: Diagnosis present

## 2021-05-16 DIAGNOSIS — Z955 Presence of coronary angioplasty implant and graft: Secondary | ICD-10-CM | POA: Diagnosis not present

## 2021-05-16 DIAGNOSIS — K810 Acute cholecystitis: Secondary | ICD-10-CM

## 2021-05-16 DIAGNOSIS — N201 Calculus of ureter: Secondary | ICD-10-CM

## 2021-05-16 DIAGNOSIS — Z8711 Personal history of peptic ulcer disease: Secondary | ICD-10-CM

## 2021-05-16 DIAGNOSIS — R109 Unspecified abdominal pain: Secondary | ICD-10-CM

## 2021-05-16 DIAGNOSIS — N179 Acute kidney failure, unspecified: Secondary | ICD-10-CM

## 2021-05-16 DIAGNOSIS — Z888 Allergy status to other drugs, medicaments and biological substances status: Secondary | ICD-10-CM

## 2021-05-16 DIAGNOSIS — R1011 Right upper quadrant pain: Secondary | ICD-10-CM

## 2021-05-16 DIAGNOSIS — N1831 Chronic kidney disease, stage 3a: Secondary | ICD-10-CM | POA: Diagnosis present

## 2021-05-16 DIAGNOSIS — Z79899 Other long term (current) drug therapy: Secondary | ICD-10-CM | POA: Diagnosis not present

## 2021-05-16 DIAGNOSIS — I129 Hypertensive chronic kidney disease with stage 1 through stage 4 chronic kidney disease, or unspecified chronic kidney disease: Secondary | ICD-10-CM | POA: Diagnosis present

## 2021-05-16 DIAGNOSIS — R0789 Other chest pain: Secondary | ICD-10-CM | POA: Diagnosis present

## 2021-05-16 DIAGNOSIS — J432 Centrilobular emphysema: Secondary | ICD-10-CM | POA: Diagnosis present

## 2021-05-16 DIAGNOSIS — D631 Anemia in chronic kidney disease: Secondary | ICD-10-CM | POA: Diagnosis present

## 2021-05-16 DIAGNOSIS — K8000 Calculus of gallbladder with acute cholecystitis without obstruction: Secondary | ICD-10-CM | POA: Diagnosis present

## 2021-05-16 DIAGNOSIS — E782 Mixed hyperlipidemia: Secondary | ICD-10-CM

## 2021-05-16 DIAGNOSIS — Z87891 Personal history of nicotine dependence: Secondary | ICD-10-CM | POA: Diagnosis not present

## 2021-05-16 DIAGNOSIS — N138 Other obstructive and reflux uropathy: Secondary | ICD-10-CM | POA: Diagnosis present

## 2021-05-16 DIAGNOSIS — A419 Sepsis, unspecified organism: Secondary | ICD-10-CM

## 2021-05-16 DIAGNOSIS — K82A1 Gangrene of gallbladder in cholecystitis: Secondary | ICD-10-CM | POA: Diagnosis present

## 2021-05-16 DIAGNOSIS — Z87442 Personal history of urinary calculi: Secondary | ICD-10-CM

## 2021-05-16 DIAGNOSIS — I10 Essential (primary) hypertension: Secondary | ICD-10-CM

## 2021-05-16 DIAGNOSIS — I252 Old myocardial infarction: Secondary | ICD-10-CM | POA: Diagnosis not present

## 2021-05-16 DIAGNOSIS — K589 Irritable bowel syndrome without diarrhea: Secondary | ICD-10-CM | POA: Diagnosis present

## 2021-05-16 DIAGNOSIS — N400 Enlarged prostate without lower urinary tract symptoms: Secondary | ICD-10-CM | POA: Diagnosis present

## 2021-05-16 DIAGNOSIS — I2510 Atherosclerotic heart disease of native coronary artery without angina pectoris: Secondary | ICD-10-CM | POA: Diagnosis present

## 2021-05-16 DIAGNOSIS — R7989 Other specified abnormal findings of blood chemistry: Secondary | ICD-10-CM | POA: Diagnosis present

## 2021-05-16 DIAGNOSIS — D72829 Elevated white blood cell count, unspecified: Secondary | ICD-10-CM

## 2021-05-16 DIAGNOSIS — R7401 Elevation of levels of liver transaminase levels: Secondary | ICD-10-CM

## 2021-05-16 LAB — CBC
HCT: 39.5 % (ref 39.0–52.0)
HCT: 38.6 % — ABNORMAL LOW (ref 39.0–52.0)
Hemoglobin: 13 g/dL (ref 13.0–17.0)
Hemoglobin: 12.4 g/dL — ABNORMAL LOW (ref 13.0–17.0)
MCH: 29.5 pg (ref 26.0–34.0)
MCH: 28.8 pg (ref 26.0–34.0)
MCHC: 32.9 g/dL (ref 30.0–36.0)
MCHC: 32.1 g/dL (ref 30.0–36.0)
MCV: 89.8 fL (ref 80.0–100.0)
MCV: 89.8 fL (ref 80.0–100.0)
Platelets: 219 10*3/uL (ref 150–400)
Platelets: 225 10*3/uL (ref 150–400)
RBC: 4.4 MIL/uL (ref 4.22–5.81)
RBC: 4.3 MIL/uL (ref 4.22–5.81)
RDW: 13.4 % (ref 11.5–15.5)
RDW: 13.2 % (ref 11.5–15.5)
WBC: 14.6 10*3/uL — ABNORMAL HIGH (ref 4.0–10.5)
WBC: 24 10*3/uL — ABNORMAL HIGH (ref 4.0–10.5)
nRBC: 0 % (ref 0.0–0.2)
nRBC: 0 % (ref 0.0–0.2)

## 2021-05-16 LAB — COMPREHENSIVE METABOLIC PANEL
ALT: 27 U/L (ref 0–44)
AST: 23 U/L (ref 15–41)
Albumin: 3.8 g/dL (ref 3.5–5.0)
Alkaline Phosphatase: 79 U/L (ref 38–126)
Anion gap: 6 (ref 5–15)
BUN: 19 mg/dL (ref 8–23)
CO2: 26 mmol/L (ref 22–32)
Calcium: 8.9 mg/dL (ref 8.9–10.3)
Chloride: 104 mmol/L (ref 98–111)
Creatinine, Ser: 1.14 mg/dL (ref 0.61–1.24)
GFR, Estimated: >60 mL/min (ref >60)
Glucose, Bld: 120 mg/dL — ABNORMAL HIGH (ref 70–99)
Potassium: 4 mmol/L (ref 3.5–5.1)
Sodium: 136 mmol/L (ref 135–145)
Total Bilirubin: 0.9 mg/dL (ref 0.3–1.2)
Total Protein: 7.1 g/dL (ref 6.5–8.1)

## 2021-05-16 LAB — LIPASE, BLOOD: Lipase: 23 U/L (ref 11–51)

## 2021-05-16 LAB — HEPATIC FUNCTION PANEL
ALT: 64 U/L — ABNORMAL HIGH (ref 0–44)
AST: 59 U/L — ABNORMAL HIGH (ref 15–41)
Albumin: 3.9 g/dL (ref 3.5–5.0)
Alkaline Phosphatase: 103 U/L (ref 38–126)
Bilirubin, Direct: 0.3 mg/dL — ABNORMAL HIGH (ref 0.0–0.2)
Indirect Bilirubin: 0.8 mg/dL (ref 0.3–0.9)
Total Bilirubin: 1.1 mg/dL (ref 0.3–1.2)
Total Protein: 7.5 g/dL (ref 6.5–8.1)

## 2021-05-16 LAB — BASIC METABOLIC PANEL
Anion gap: 9 (ref 5–15)
BUN: 18 mg/dL (ref 8–23)
CO2: 25 mmol/L (ref 22–32)
Calcium: 8.9 mg/dL (ref 8.9–10.3)
Chloride: 101 mmol/L (ref 98–111)
Creatinine, Ser: 1.15 mg/dL (ref 0.61–1.24)
GFR, Estimated: >60 mL/min (ref >60)
Glucose, Bld: 144 mg/dL — ABNORMAL HIGH (ref 70–99)
Potassium: 4.2 mmol/L (ref 3.5–5.1)
Sodium: 135 mmol/L (ref 135–145)

## 2021-05-16 LAB — TROPONIN I (HIGH SENSITIVITY)
Troponin I (High Sensitivity): 7 ng/L (ref <18)
Troponin I (High Sensitivity): 8 ng/L (ref <18)

## 2021-05-16 MED ORDER — PANTOPRAZOLE SODIUM 40 MG PO TBEC: 40.0000 mg | DELAYED_RELEASE_TABLET | Freq: Two times a day (BID) | ORAL | 0 refills | Status: DC

## 2021-05-16 MED ORDER — POLYETHYLENE GLYCOL 3350 17 G PO PACK
17.0000 g | PACK | Freq: Every day | ORAL | Status: DC
  Administered 2021-05-16: 17 g via ORAL
  Filled 2021-05-16: qty 1

## 2021-05-16 MED ORDER — SODIUM CHLORIDE 0.9 % IV SOLN: INTRAVENOUS | Status: DC

## 2021-05-16 MED ORDER — PANTOPRAZOLE SODIUM 40 MG IV SOLR
40.0000 mg | Freq: Two times a day (BID) | INTRAVENOUS | Status: DC
  Administered 2021-05-17 – 2021-05-19 (×4): 40 mg via INTRAVENOUS
  Filled 2021-05-16 (×5): qty 10

## 2021-05-16 MED ORDER — ALUM & MAG HYDROXIDE-SIMETH 200-200-20 MG/5ML PO SUSP
30.0000 mL | Freq: Once | ORAL | Status: AC
  Administered 2021-05-16: 30 mL via ORAL
  Filled 2021-05-16: qty 30

## 2021-05-16 MED ORDER — PANTOPRAZOLE SODIUM 40 MG IV SOLR
40.0000 mg | Freq: Once | INTRAVENOUS | Status: AC
  Administered 2021-05-16: 40 mg via INTRAVENOUS
  Filled 2021-05-16: qty 10

## 2021-05-16 MED ORDER — PIPERACILLIN-TAZOBACTAM 3.375 G IVPB
3.3750 g | Freq: Three times a day (TID) | INTRAVENOUS | Status: DC
  Administered 2021-05-17 – 2021-05-19 (×7): 3.375 g via INTRAVENOUS
  Filled 2021-05-16 (×8): qty 50

## 2021-05-16 MED ORDER — PIPERACILLIN-TAZOBACTAM 3.375 G IVPB 30 MIN
3.3750 g | Freq: Once | INTRAVENOUS | Status: AC
  Administered 2021-05-16: 3.375 g via INTRAVENOUS
  Filled 2021-05-16: qty 50

## 2021-05-16 MED ORDER — HYDROMORPHONE HCL 1 MG/ML IJ SOLN
0.5000 mg | INTRAMUSCULAR | Status: DC | PRN
  Administered 2021-05-16 – 2021-05-18 (×7): 0.5 mg via INTRAVENOUS
  Filled 2021-05-16 (×7): qty 0.5

## 2021-05-16 MED ORDER — HYDROMORPHONE HCL 1 MG/ML IJ SOLN
1.0000 mg | Freq: Once | INTRAMUSCULAR | Status: AC
  Administered 2021-05-16: 1 mg via INTRAVENOUS
  Filled 2021-05-16: qty 1

## 2021-05-16 MED ORDER — HYDROMORPHONE HCL 1 MG/ML IJ SOLN
0.5000 mg | Freq: Once | INTRAMUSCULAR | Status: AC
  Administered 2021-05-16: 0.5 mg via INTRAVENOUS
  Filled 2021-05-16: qty 0.5

## 2021-05-16 MED ORDER — ONDANSETRON HCL 4 MG/2ML IJ SOLN
4.0000 mg | Freq: Once | INTRAMUSCULAR | Status: AC
  Administered 2021-05-16: 4 mg via INTRAVENOUS
  Filled 2021-05-16: qty 2

## 2021-05-16 NOTE — Discharge Summary (Addendum)
Physician Discharge Summary  ?Johnny Johns [id number] DOB: 25-Feb-1950 DOA: 05/14/2021 ? ?PCP: Asencion Noble, MD ? ?Admit date: 05/14/2021 ?Discharge date: 05/16/2021 ? ?Admitted From: Home ?Disposition:  Home ? ?Recommendations for Outpatient Follow-up:  ?Follow up with PCP in 1 week ?Follow up with Dr. Arnoldo Morale, general surgery, for outpatient HIDA scan  ? ?Discharge Condition: Stable ?CODE STATUS: Full  ?Diet recommendation: Regular  ? ?Brief/Interim Summary: ?Johnny Johns is a 71 y.o. male with medical history significant of coronary artery disease, chronic neck pain and cervical spinal stenosis, hyperlipidemia, hypertension, irritable bowel syndrome who presents to the hospital with complaints of chest pain which started last night around 9 PM.  He states that he was in his regular state of health when he started to have central chest pain 10 out of 10 in severity, burning and gripping.  Chest pain lasted about 6 hours.  Finally resolved in the emergency department around 3 AM.  Currently, patient is in stepdown unit and having similar pain 2 out of 10 in severity.  He admits to diaphoresis and flushing last night at onset of chest pain.  Denies any radiating pain to his back or his arms.  No recent illnesses including fevers, cough, diarrhea. Troponin remained negative, EKG reassuring, CTA chest abdomen pelvis negative for intrathoracic pathology, no aortic aneurysm or dissection.  Did show right ureteral calculus, no hydronephrosis, cholelithiasis, diverticulosis.  Abdominal US shows distended gallbladder with mild wall thickening, no gallstones or sludge identified.  Difficult to exclude a calculus cholecystitis. LFTs unremarkable.  Echocardiogram was unremarkable.  Cardiology consulted, ruled out for cardiac etiology of chest pain.  General surgery was consulted, recommended HIDA scan.  Patient did not want to wait over the weekend to get HIDA scan on Monday, so this will be arranged as outpatient. ? ?Discharge  Diagnoses:  ?Principal Problem: ?  Atypical chest pain ?Active Problems: ?  Hyperlipidemia ?  BPH (benign prostatic hyperplasia) ?  HTN (hypertension) ?  Stage 3a chronic kidney disease (CKD) ([place]) ? ? ?Atypical chest pain, suspect GI source ?-EKG reassuring ?-Troponin remains negative x3  ?-Abdominal US shows distended gallbladder with mild wall thickening, no gallstones or sludge identified.  Difficult to exclude a calculus cholecystitis. LFTs unremarkable.  ?-Echocardiogram with normal LV function, no regional wall motion abnormality, normal EF ?-Ruled out for cardiac etiology of chest pain ?-Trial PPI ? ?Acalculous cholecystitis  ?-General surgery consulted, recommended HIDA scan (unavailable inpatient over the weekend) ?  ?CKD stage IIIa ?-Baseline creatinine 1.2 ?-Stable ?  ?Hyperlipidemia ?-Pravachol ? ?Hypertension ?-Diovan ? ?BPH ?-Flomax ? ?Discharge Instructions ? ?Discharge Instructions   ? ? Call MD for:  difficulty breathing, headache or visual disturbances   Complete by: As directed ?  ? Call MD for:  extreme fatigue   Complete by: As directed ?  ? Call MD for:  persistant dizziness or light-headedness   Complete by: As directed ?  ? Call MD for:  persistant nausea and vomiting   Complete by: As directed ?  ? Call MD for:  severe uncontrolled pain   Complete by: As directed ?  ? Call MD for:  temperature >100.4   Complete by: As directed ?  ? Diet - low sodium heart healthy   Complete by: As directed ?  ? Discharge instructions   Complete by: As directed ?  ? You were cared for by a hospitalist during your hospital stay. If you have any questions about your discharge medications or the care you  received while you were in the hospital after you are discharged, you can call the unit and ask to speak with the hospitalist on call if the hospitalist that took care of you is not available. Once you are discharged, your primary care physician will handle any further medical issues. Please note that NO  REFILLS for any discharge medications will be authorized once you are discharged, as it is imperative that you return to your primary care physician (or establish a relationship with a primary care physician if you do not have one) for your aftercare needs so that they can reassess your need for medications and monitor your lab values.  ? Increase activity slowly   Complete by: As directed ?  ? No wound care   Complete by: As directed ?  ? ?  ? ?Allergies as of 05/16/2021   ? ?   Reactions  ? Gemfibrozil   ? Metoprolol Other (See Comments)  ? Myalgias and neck pain  ? Simvastatin Other (See Comments)  ? Myalgias; also Vytorin, pravastatin and Lipitor  ? Statins Other (See Comments)  ? Muscle aches  ? Vioxx [rofecoxib]   ? ?  ? ?  ?Medication List  ?  ? ?TAKE these medications   ? ?celecoxib 200 MG capsule ?Commonly known as: CELEBREX ?Take 200 mg by mouth 2 (two) times daily. ?  ?methocarbamol 750 MG tablet ?Commonly known as: ROBAXIN ?Take 750 mg by mouth 3 (three) times daily as needed. ?  ?pantoprazole 40 MG tablet ?Commonly known as: Protonix ?Take 1 tablet (40 mg total) by mouth 2 (two) times daily before a meal. ?  ?pravastatin 10 MG tablet ?Commonly known as: PRAVACHOL ?Take 10 mg by mouth once a week. ?  ?tamsulosin 0.4 MG Caps capsule ?Commonly known as: FLOMAX ?Take 1 capsule (0.4 mg total) by mouth daily. ?  ?valsartan 320 MG tablet ?Commonly known as: DIOVAN ?Take 320 mg by mouth at bedtime. ?  ? ?  ? ? Follow-up Information   ? ? Aviva Signs, MD Follow up.   ?Specialty: General Surgery ?Why: Outpatient HIDA scan recommended ?Contact information: ?[address] ?[place] [address] ?[phone] ? ? ?  ?  ? ? Asencion Noble, MD Follow up.   ?Specialty: Internal Medicine ?Contact information: ?[address] ?[place] [address] ?[phone] ? ? ?  ?  ? ?  ?  ? ?  ? ?Allergies  ?Allergen Reactions  ? Gemfibrozil   ? Metoprolol Other (See Comments)  ?  Myalgias and neck pain  ? Simvastatin  Other (See Comments)  ?  Myalgias; also Vytorin, pravastatin and Lipitor  ? Statins Other (See Comments)  ?  Muscle aches  ? Vioxx [Rofecoxib]   ? ? ?Consultations: ?Cardiology ?General Surgery  ? ? ?Procedures/Studies: ?DG Chest 2 View ? ?Result Date: 05/15/2021 ?CLINICAL DATA:  Chest pain for 2 hours with nausea, initial encounter EXAM: CHEST - 2 VIEW COMPARISON:  04/09/2004 FINDINGS: The heart size and mediastinal contours are within normal limits. Both lungs are clear. The visualized skeletal structures are unremarkable. IMPRESSION: No active cardiopulmonary disease. Electronically Signed   By: Inez Catalina M.D.   On: 05/15/2021 00:12  ? ?US Abdomen Limited ? ?Result Date: 05/15/2021 ?CLINICAL DATA:  71 year old male with suspected cholelithiasis on CT. EXAM: ULTRASOUND ABDOMEN LIMITED RIGHT UPPER QUADRANT COMPARISON:  CT Chest, Abdomen, and Pelvis 0110 hours today. FINDINGS: Gallbladder: Gallbladder distension similar to the earlier CT. Mild wall thickening up to of between 4 to  6 mm (image 17). No cholelithiasis or sludge identified. There is trace pericholecystic fluid (image 41). Common bile duct: Diameter: 4-5 mm, within normal limits. Liver: Increased liver echogenicity. Some areas of the liver are difficult to penetrate. No discrete liver lesion or intrahepatic biliary ductal dilatation is identified. Portal vein is patent on color Doppler imaging with normal direction of blood flow towards the liver. Other: Negative visible right kidney. IMPRESSION: 1. Distended gallbladder with mild wall thickening and pericholecystic fluid. No gallstones or sludge identified. Difficult to exclude Acalculous Cholecystitis. 2. No evidence of bile duct obstruction. 3. Hepatic steatosis. Electronically Signed   By: Genevie Ann M.D.   On: 05/15/2021 07:41  ? ?ECHOCARDIOGRAM COMPLETE ? ?Result Date: 05/15/2021 ?   ECHOCARDIOGRAM REPORT   Patient Name:   Johnny Johns Date of Exam: 05/15/2021 Medical Rec #:  [id number]      Height:        70.0 in Accession #:    [id number]     Weight:       242.9 lb Date of Birth:  Jul 21, 1950      BSA:          2.267 m? Patient Age:    28 years       BP:           111/74 mmHg Patient Gender: M

## 2021-05-16 NOTE — ED Provider Notes (Signed)
?[facility] EMERGENCY DEPARTMENT ?Provider Note ? ? ?CSN: [id number] ?Arrival date & time: 05/16/21  1748 ? ?  ? ?History ? ?Chief Complaint  ?Patient presents with  ? Chest Pain  ? ? ?Johnny Johns is a 71 y.o. male. ? ?Patient was admitted couple days ago with chest pain and had an ultrasound that showed possible acalculous cholecystitis.  He was improving so he was discharged home.  Then he started having pain once again..  Patient has past medical history of GERD and elevated cholesterol ? ?The history is provided by the patient and medical records. No language interpreter was used.  ?Chest Pain ?Pain location:  Epigastric ?Pain quality: aching   ?Pain radiates to:  Does not radiate ?Pain severity:  Moderate ?Onset quality:  Sudden ?Timing:  Constant ?Progression:  Worsening ?Chronicity:  Recurrent ?Context: not breathing   ?Relieved by:  Nothing ?Worsened by:  Nothing ?Associated symptoms: abdominal pain   ?Associated symptoms: no back pain, no cough, no fatigue and no headache   ? ?  ? ?Home Medications ?Prior to Admission medications   ?Medication Sig Start Date End Date Taking? Authorizing Provider  ?celecoxib (CELEBREX) 200 MG capsule Take 200 mg by mouth 2 (two) times daily. 05/11/21  Yes [provider]  ?methocarbamol (ROBAXIN) 750 MG tablet Take 750 mg by mouth 3 (three) times daily as needed. 05/11/21  Yes [provider]  ?pantoprazole (PROTONIX) 40 MG tablet Take 1 tablet (40 mg total) by mouth 2 (two) times daily before a meal. 05/16/21 06/15/21 Yes Noralee Stainhoi, Jennifer, DO  ?tamsulosin (FLOMAX) 0.4 MG CAPS capsule Take 1 capsule (0.4 mg total) by mouth daily. 03/16/21  Yes Stoneking, Danford BadBradley J., MD  ?valsartan (DIOVAN) 320 MG tablet Take 320 mg by mouth at bedtime. 12/15/20  Yes [provider]  ?acetaminophen (TYLENOL) 650 MG CR tablet Take 1,300 mg by mouth every 8 (eight) hours as needed for pain.    [provider]  ?pravastatin (PRAVACHOL) 10 MG tablet Take 10 mg by mouth  once a week. 03/23/21   [provider]  ?   ? ?Allergies    ?Gemfibrozil, Metoprolol, Simvastatin, Statins, and Vioxx [rofecoxib]   ? ?Review of Systems   ?Review of Systems  ?Constitutional:  Negative for appetite change and fatigue.  ?HENT:  Negative for congestion, ear discharge and sinus pressure.   ?Eyes:  Negative for discharge.  ?Respiratory:  Negative for cough.   ?Cardiovascular:  Positive for chest pain.  ?Gastrointestinal:  Positive for abdominal pain. Negative for diarrhea.  ?Genitourinary:  Negative for frequency and hematuria.  ?Musculoskeletal:  Negative for back pain.  ?Skin:  Negative for rash.  ?Neurological:  Negative for seizures and headaches.  ?Psychiatric/Behavioral:  Negative for hallucinations.   ? ?Physical Exam ?Updated Vital Signs ?BP 128/82   Pulse 90   Temp (!) 97.5 ?F (36.4 ?C) (Oral)   Resp 19   Ht 5\' 10"  (1.778 m)   Wt 106.6 kg   SpO2 100%   BMI 33.72 kg/m?  ?Physical Exam ?Vitals and nursing note reviewed.  ?Constitutional:   ?   Appearance: He is well-developed.  ?HENT:  ?   Head: Normocephalic.  ?   Nose: Nose normal.  ?Eyes:  ?   General: No scleral icterus. ?   Conjunctiva/sclera: Conjunctivae normal.  ?Neck:  ?   Thyroid: No thyromegaly.  ?Cardiovascular:  ?   Rate and Rhythm: Normal rate and regular rhythm.  ?   Heart sounds: No murmur heard. ?  No friction rub. No gallop.  ?Pulmonary:  ?   Breath sounds: No stridor. No wheezing or rales.  ?Chest:  ?   Chest wall: No tenderness.  ?Abdominal:  ?   General: There is no distension.  ?   Tenderness: There is abdominal tenderness. There is no rebound.  ?Musculoskeletal:     ?   General: Normal range of motion.  ?   Cervical back: Neck supple.  ?Lymphadenopathy:  ?   Cervical: No cervical adenopathy.  ?Skin: ?   Findings: No erythema or rash.  ?Neurological:  ?   Mental Status: He is alert and oriented to person, place, and time.  ?   Motor: No abnormal muscle tone.  ?   Coordination: Coordination normal.   ?Psychiatric:     ?   Behavior: Behavior normal.  ? ? ?ED Results / Procedures / Treatments   ?Labs ?(all labs ordered are listed, but only abnormal results are displayed) ?Labs Reviewed  ?BASIC METABOLIC PANEL - Abnormal; Notable for the following components:  ?    Result Value  ? Glucose, Bld 144 (*)   ? All other components within normal limits  ?HEPATIC FUNCTION PANEL - Abnormal; Notable for the following components:  ? AST 59 (*)   ? ALT 64 (*)   ? Bilirubin, Direct 0.3 (*)   ? All other components within normal limits  ?CBC - Abnormal; Notable for the following components:  ? WBC 24.0 (*)   ? Hemoglobin 12.4 (*)   ? HCT 38.6 (*)   ? All other components within normal limits  ?LIPASE, BLOOD  ?TROPONIN I (HIGH SENSITIVITY)  ?TROPONIN I (HIGH SENSITIVITY)  ? ? ?EKG ?None ? ?Radiology ?DG Chest 2 View ? ?Result Date: 05/15/2021 ?CLINICAL DATA:  Chest pain for 2 hours with nausea, initial encounter EXAM: CHEST - 2 VIEW COMPARISON:  04/09/2004 FINDINGS: The heart size and mediastinal contours are within normal limits. Both lungs are clear. The visualized skeletal structures are unremarkable. IMPRESSION: No active cardiopulmonary disease. Electronically Signed   By: Alcide Clever M.D.   On: 05/15/2021 00:12  ? ?US Abdomen Limited ? ?Result Date: 05/15/2021 ?CLINICAL DATA:  71 year old male with suspected cholelithiasis on CT. EXAM: ULTRASOUND ABDOMEN LIMITED RIGHT UPPER QUADRANT COMPARISON:  CT Chest, Abdomen, and Pelvis 0110 hours today. FINDINGS: Gallbladder: Gallbladder distension similar to the earlier CT. Mild wall thickening up to of between 4 to 6 mm (image 17). No cholelithiasis or sludge identified. There is trace pericholecystic fluid (image 41). Common bile duct: Diameter: 4-5 mm, within normal limits. Liver: Increased liver echogenicity. Some areas of the liver are difficult to penetrate. No discrete liver lesion or intrahepatic biliary ductal dilatation is identified. Portal vein is patent on color Doppler  imaging with normal direction of blood flow towards the liver. Other: Negative visible right kidney. IMPRESSION: 1. Distended gallbladder with mild wall thickening and pericholecystic fluid. No gallstones or sludge identified. Difficult to exclude Acalculous Cholecystitis. 2. No evidence of bile duct obstruction. 3. Hepatic steatosis. Electronically Signed   By: Odessa Fleming M.D.   On: 05/15/2021 07:41  ? ?ECHOCARDIOGRAM COMPLETE ? ?Result Date: 05/15/2021 ?   ECHOCARDIOGRAM REPORT   Patient Name:   JOSEALFREDO ADKINS Date of Exam: 05/15/2021 Medical Rec #:  [id number]      Height:       70.0 in Accession #:    [id number]     Weight:       242.9 lb Date of Birth:  11-14-1950      BSA:          2.267 m? Patient Age:    71 years       BP:           111/74 mmHg Patient Gender: M              HR:           81 bpm. [facility] Procedure: 2D Echo, Cardiac Doppler and Color Doppler Indications:    Chest Pain  History:        Patient has no prior history of Echocardiogram examinations.                 Previous Myocardial Infarction and CAD, Signs/Symptoms:Chest                 Pain; Risk Factors:Dyslipidemia and Former Smoker.  Sonographer:    Mikki Harbor Referring Phys: [id number] OLADAPO ADEFESO IMPRESSIONS  1. Left ventricular ejection fraction, by estimation, is 60 to 65%. The left ventricle has normal function. The left ventricle has no regional wall motion abnormalities. There is moderate concentric left ventricular hypertrophy. Left ventricular diastolic parameters are indeterminate.  2. Right ventricular systolic function is normal. The right ventricular size is normal.  3. The mitral valve is normal in structure. No evidence of mitral valve regurgitation. No evidence of mitral stenosis.  4. The aortic valve is tricuspid. Aortic valve regurgitation is not visualized. Aortic valve sclerosis/calcification is present, without any evidence of aortic stenosis. Aortic valve area, by VTI measures 2.47 cm?Marland Kitchen Aortic valve  mean gradient measures 4.0 mmHg. Aortic valve Vmax measures 1.31 m/s. FINDINGS  Left Ventricle: Left ventricular ejection fraction, by estimation, is 60 to 65%. The left ventricle has normal function. The left ventricle has

## 2021-05-16 NOTE — Consult Note (Signed)
Reason for Consult: Epigastric/chest pain ?Referring Physician: Dr. Alvino Chapel ? ?Johnny Johns is an 71 y.o. male.  ?HPI: Patient is a 70 year old white male with multiple medical problems including coronary artery disease, IBS, and hypertension who presented to [facility] on 05/15/2021 with chest pain.  The chest pain is lasted approximately 6 hours.  This felt similar to an episode of chest pain which ended up being an MI in 2015.  During his work-up, he was noted to have an elevated lipase, but his LFTs were within normal limits.  An ultrasound the gallbladder revealed a thickened gallbladder wall with trace pericholecystic fluid but no cholelithiasis.  Patient states this was his first episode.  He denies any fever, chills, fatty food intolerance, or jaundice.  Work-up for his chest pain was negative for cardiac etiology.  He states he still gets a little bit of an upset stomach with chest pain.  He denies any right upper quadrant abdominal pain. ? ?Past Medical History:  ?Diagnosis Date  ? 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 Presented with acute myocardial infarction In 01/2003; isolated 90% proximal LAD lesion treated with DES; EF 45-50%  ? Degenerative joint disease   ? Chronic neck and back pain  ? Hyperlipidemia   ? lipid profile in 06/2010:195, 207, 37, 117  ? Hypertension   ? IBS (irritable bowel syndrome)   ? Myocardial infarction [facility])   ? PUD (peptic ulcer disease)   ? hx  ? Tobacco abuse, in remission   ? ? ?Past Surgical History:  ?Procedure Laterality Date  ? CARDIAC CATHETERIZATION    ? stent-2005  ? COLONOSCOPY  01/11/2006  ? CYSTOSCOPY W/ URETERAL STENT PLACEMENT Right 02/06/2021  ? Procedure: CYSTOSCOPY WITH RIGHT RETROGRADE PYELOGRAM/ RIGHT URETERAL STENT PLACEMENT/ RIGHT URETEROSCOPY;  Surgeon: Milderd Meager., MD;  Location: [facility];  Service: Urology;  Laterality: Right;  ? EXTRACORPOREAL SHOCK WAVE LITHOTRIPSY Right 02/10/2021  ? Procedure: EXTRACORPOREAL SHOCK  WAVE LITHOTRIPSY (ESWL);  Surgeon: Milderd Meager., MD;  Location: [facility];  Service: Urology;  Laterality: Right;  ? KIDNEY STONE SURGERY  01/12/1988  ? ? ?History reviewed. No pertinent family history. ? ?Social History:  reports that he quit smoking about 18 years ago. His smoking use included cigarettes. He has a 30.00 pack-year smoking history. He has never used smokeless tobacco. He reports that he does not drink alcohol and does not use drugs. ? ?Allergies:  ?Allergies  ?Allergen Reactions  ? Gemfibrozil   ? Metoprolol Other (See Comments)  ?  Myalgias and neck pain  ? Simvastatin Other (See Comments)  ?  Myalgias; also Vytorin, pravastatin and Lipitor  ? Statins Other (See Comments)  ?  Muscle aches  ? Vioxx [Rofecoxib]   ? ? ?Medications: I have reviewed the patient's current medications. ? ?Results for orders placed or performed during the hospital encounter of 05/14/21 (from the past 48 hour(s))  ?Troponin I (High Sensitivity)     Status: None  ? Collection Time: 05/14/21 11:43 PM  ?Result Value Ref Range  ? Troponin I (High Sensitivity) 5 <18 ng/L  ?  Comment: (NOTE) ?Elevated high sensitivity troponin I (hsTnI) values and significant  ?changes across serial measurements may suggest ACS but many other  ?chronic and acute conditions are known to elevate hsTnI results.  ?Refer to the "Links" section for chest pain algorithms and additional  ?guidance. ?Performed at [facility], [address], [place], [place] [address] ?  ?CBC     Status:  Abnormal  ? Collection Time: 05/14/21 11:43 PM  ?Result Value Ref Range  ? WBC 10.7 (H) 4.0 - 10.5 K/uL  ? RBC 4.52 4.22 - 5.81 MIL/uL  ? Hemoglobin 13.3 13.0 - 17.0 g/dL  ? HCT 41.3 39.0 - 52.0 %  ? MCV 91.4 80.0 - 100.0 fL  ? MCH 29.4 26.0 - 34.0 pg  ? MCHC 32.2 30.0 - 36.0 g/dL  ? RDW 13.5 11.5 - 15.5 %  ? Platelets 270 150 - 400 K/uL  ? nRBC 0.0 0.0 - 0.2 %  ?  Comment: Performed at [facility], [address] [place], [place] [address]  ?Comprehensive  metabolic panel     Status: Abnormal  ? Collection Time: 05/14/21 11:43 PM  ?Result Value Ref Range  ? Sodium 138 135 - 145 mmol/L  ? Potassium 4.0 3.5 - 5.1 mmol/L  ? Chloride 105 98 - 111 mmol/L  ? CO2 26 22 - 32 mmol/L  ? Glucose, Bld 110 (H) 70 - 99 mg/dL  ?  Comment: Glucose reference range applies only to samples taken after fasting for at least 8 hours.  ? BUN 28 (H) 8 - 23 mg/dL  ? Creatinine, Ser 1.35 (H) 0.61 - 1.24 mg/dL  ? Calcium 9.4 8.9 - 10.3 mg/dL  ? Total Protein 7.5 6.5 - 8.1 g/dL  ? Albumin 4.0 3.5 - 5.0 g/dL  ? AST 18 15 - 41 U/L  ? ALT 22 0 - 44 U/L  ? Alkaline Phosphatase 89 38 - 126 U/L  ? Total Bilirubin 0.5 0.3 - 1.2 mg/dL  ? GFR, Estimated 55 (L) >60 mL/min  ?  Comment: (NOTE) ?Calculated using the CKD-EPI Creatinine Equation (2021) ?  ? Anion gap 7 5 - 15  ?  Comment: Performed at [facility], [address] [place], [place] [address]  ?Lipase, blood     Status: Abnormal  ? Collection Time: 05/14/21 11:43 PM  ?Result Value Ref Range  ? Lipase 92 (H) 11 - 51 U/L  ?  Comment: Performed at [facility], [address] [place], [place] [address]  ?Troponin I (High Sensitivity)     Status: None  ? Collection Time: 05/15/21  1:22 AM  ?Result Value Ref Range  ? Troponin I (High Sensitivity) 4 <18 ng/L  ?  Comment: (NOTE) ?Elevated high sensitivity troponin I (hsTnI) values and significant  ?changes across serial measurements may suggest ACS but many other  ?chronic and acute conditions are known to elevate hsTnI results.  ?Refer to the "Links" section for chest pain algorithms and additional  ?guidance. ?Performed at [facility], [address] [place], [place] [address] ?  ?MRSA Next Gen by PCR, Nasal     Status: None  ? Collection Time: 05/15/21  7:17 AM  ? Specimen: Nasal Mucosa; Nasal Swab  ?Result Value Ref Range  ? MRSA by PCR Next Gen NOT DETECTED NOT DETECTED  ?  Comment: (NOTE) ?The GeneXpert MRSA Assay (FDA approved for NASAL specimens only), ?is one component of a comprehensive MRSA  colonization surveillance ?program. It is not intended to diagnose MRSA infection nor to guide ?or monitor treatment for MRSA infections. ?Test performance is not FDA approved in patients less than 2 years ?old. ?Performed at [facility], [address] [place], [place] [address] ?  ?Troponin I (High Sensitivity)     Status: None  ? Collection Time: 05/15/21  7:36 AM  ?Result Value Ref Range  ? Troponin I (High Sensitivity) 4 <18 ng/L  ?  Comment: (NOTE) ?Elevated high sensitivity troponin I (hsTnI) values and significant  ?changes across serial measurements may suggest ACS but many other  ?chronic and acute conditions are known to elevate hsTnI results.  ?Refer to the "Links" section for chest pain algorithms and additional  ?guidance. ?Performed at [facility], [address], [place], [place] [address] ?  ?CBC     Status: Abnormal  ? Collection Time: 05/16/21  8:15 AM  ?Result Value Ref Range  ? WBC 14.6 (H) 4.0 - 10.5 K/uL  ? RBC 4.40 4.22 - 5.81 MIL/uL  ? Hemoglobin 13.0 13.0 - 17.0 g/dL  ? HCT 39.5 39.0 - 52.0 %  ? MCV 89.8 80.0 - 100.0 fL  ? MCH 29.5 26.0 - 34.0 pg  ? MCHC 32.9 30.0 - 36.0 g/dL  ? RDW 13.4 11.5 - 15.5 %  ? Platelets 219 150 - 400 K/uL  ? nRBC 0.0 0.0 - 0.2 %  ?  Comment: Performed at [facility], [address] [place], [place] [address]  ?Comprehensive metabolic panel     Status: Abnormal  ? Collection Time: 05/16/21  8:15 AM  ?Result Value Ref Range  ? Sodium 136 135 - 145 mmol/L  ? Potassium 4.0 3.5 - 5.1 mmol/L  ? Chloride 104 98 - 111 mmol/L  ? CO2 26 22 - 32 mmol/L  ? Glucose, Bld 120 (H) 70 - 99 mg/dL  ?  Comment: Glucose reference range applies only to samples taken after fasting for at least 8 hours.  ? BUN 19 8 - 23 mg/dL  ? Creatinine, Ser 1.14 0.61 - 1.24 mg/dL  ? Calcium 8.9 8.9 - 10.3 mg/dL  ? Total Protein 7.1 6.5 - 8.1 g/dL  ? Albumin 3.8 3.5 - 5.0 g/dL  ? AST 23 15 - 41 U/L  ? ALT 27 0 - 44 U/L  ? Alkaline Phosphatase 79 38 - 126 U/L  ? Total Bilirubin 0.9 0.3 - 1.2 mg/dL   ? GFR, Estimated >60 >60 mL/min  ?  Comment: (NOTE) ?Calculated using the CKD-EPI Creatinine Equation (2021) ?  ? Anion gap 6 5 - 15  ?  Comment: Performed at [facility], [address], [place], [place]

## 2021-05-16 NOTE — ED Triage Notes (Signed)
Pt arrived in POV c/o chest pain since discharge this morning. Central chest pain.  ?

## 2021-05-16 NOTE — H&P (Signed)
?History and Physical  ? ? ?Patient: Johnny Johns [id number] DOB: 02-06-1950 ?DOA: 05/16/2021 ?DOS: the patient was seen and examined on 05/16/2021 ?PCP: Asencion Noble, MD  ?Patient coming from: Home ? ?Chief Complaint:  ?Chief Complaint  ?Patient presents with  ? Chest Pain  ? ?HPI: Johnny Johns is a 71 y.o. male with medical history significant of hypertension, hyperlipidemia, chronic neck pain and cervical spinal stenosis, CAD, IBS who presents to the emergency department due to recurrence of midsternal burning sensation associated sweating which started shortly after arriving at home from being discharged home earlier today.   ?Patient was admitted on 05/14/2021 due to midsternal chest pain which started on 5/3 and 9 PM and was described as burning sensation associated with diaphoresis, pain was rated as 10/10 in severity on admission, but improved several hours after being in the ED.  Extensive work-up done including echocardiogram and cardiology consult ruled out cardiac etiology.  Abdominal ultrasound showed distended gallbladder with mild wall thickening, no gallstones or sludge identified.  Difficult to exclude acalculus cholecystitis.  General surgery was consulted and recommended HIDA scan, however, this is not available until Monday, patient did not want to stay in the hospital till Monday for HIDA scan, so he requested to be discharged with plan to make arrangements to do this as an outpatient.  On arrival at home, chest pain returned with diaphoresis, but now the pain radiates to right upper quadrant.  He denies fever, chills, nausea, vomiting, headache, blurry vision, diarrhea, constipation.  Last bowel movement was this afternoon PTA. ? ?ED Course:  ?In the emergency department, temperature was 97.85F, BP 152/91 and other vital signs were within normal range.  Work-up in the ED showed leukocytosis, normocytic anemia, transaminitis, troponin x2 was normal, lipase 23. ?Chest x-ray showed chronic appearing  increased interstitial lung markings with mild bibasilar atelectatic changes, stable from the prior study. ?Patient was treated with IV Dilaudid, Zofran was given, Protonix was given.  General surgery was consulted and recommended admitting the patient with plan to see patient in the morning, IV antibiotics was recommended per ED physician.  Hospitalist was asked to admit patient for further evaluation and management. ?  ? ?Review of Systems: ?Review of systems as noted in the HPI. All other systems reviewed and are negative. ? ? ?Past Medical History:  ?Diagnosis Date  ? 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 Presented with acute myocardial infarction In 01/2003; isolated 90% proximal LAD lesion treated with DES; EF 45-50%  ? Degenerative joint disease   ? Chronic neck and back pain  ? Hyperlipidemia   ? lipid profile in 06/2010:195, 207, 37, 117  ? Hypertension   ? IBS (irritable bowel syndrome)   ? Myocardial infarction [facility])   ? PUD (peptic ulcer disease)   ? hx  ? Tobacco abuse, in remission   ? ?Past Surgical History:  ?Procedure Laterality Date  ? CARDIAC CATHETERIZATION    ? stent-2005  ? COLONOSCOPY  01/11/2006  ? CYSTOSCOPY W/ URETERAL STENT PLACEMENT Right 02/06/2021  ? Procedure: CYSTOSCOPY WITH RIGHT RETROGRADE PYELOGRAM/ RIGHT URETERAL STENT PLACEMENT/ RIGHT URETEROSCOPY;  Surgeon: Primus Bravo., MD;  Location: [facility];  Service: Urology;  Laterality: Right;  ? EXTRACORPOREAL SHOCK WAVE LITHOTRIPSY Right 02/10/2021  ? Procedure: EXTRACORPOREAL SHOCK WAVE LITHOTRIPSY (ESWL);  Surgeon: Primus Bravo., MD;  Location: [facility];  Service: Urology;  Laterality: Right;  ? KIDNEY STONE SURGERY  01/12/1988  ? ? ?Social History:  reports that he quit  smoking about 18 years ago. His smoking use included cigarettes. He has a 30.00 pack-year smoking history. He has never used smokeless tobacco. He reports that he does not drink alcohol and does not use drugs. ? ? ?Allergies  ?Allergen  Reactions  ? Gemfibrozil   ? Metoprolol Other (See Comments)  ?  Myalgias and neck pain  ? Simvastatin Other (See Comments)  ?  Myalgias; also Vytorin, pravastatin and Lipitor  ? Statins Other (See Comments)  ?  Muscle aches  ? Vioxx [Rofecoxib]   ? ? ?Family history: ?No pertinent family history ? ? ?Prior to Admission medications   ?Medication Sig Start Date End Date Taking? Authorizing Provider  ?celecoxib (CELEBREX) 200 MG capsule Take 200 mg by mouth 2 (two) times daily. 05/11/21  Yes [provider]  ?methocarbamol (ROBAXIN) 750 MG tablet Take 750 mg by mouth 3 (three) times daily as needed. 05/11/21  Yes [provider]  ?pantoprazole (PROTONIX) 40 MG tablet Take 1 tablet (40 mg total) by mouth 2 (two) times daily before a meal. 05/16/21 06/15/21 Yes Dessa Phi, DO  ?tamsulosin (FLOMAX) 0.4 MG CAPS capsule Take 1 capsule (0.4 mg total) by mouth daily. 03/16/21  Yes Stoneking, Reece Leader., MD  ?valsartan (DIOVAN) 320 MG tablet Take 320 mg by mouth at bedtime. 12/15/20  Yes [provider]  ?acetaminophen (TYLENOL) 650 MG CR tablet Take 1,300 mg by mouth every 8 (eight) hours as needed for pain.    [provider]  ?pravastatin (PRAVACHOL) 10 MG tablet Take 10 mg by mouth once a week. 03/23/21   [provider]  ? ? ?Physical Exam: ?BP 121/80   Pulse 95   Temp (!) 97.5 ?F (36.4 ?C) (Oral)   Resp 18   Ht 5\' 10"  (1.778 m)   Wt 106.6 kg   SpO2 99%   BMI 33.72 kg/m?  ? ?General: 71 y.o. year-old male well developed well nourished in no acute distress.  Alert and oriented x3. ?HEENT: NCAT, EOMI ?Neck: Supple, trachea medial ?Cardiovascular: Regular rate and rhythm with no rubs or gallops.  No thyromegaly or JVD noted.  No lower extremity edema. 2/4 pulses in all 4 extremities. ?Respiratory: Clear to auscultation with no wheezes or rales. Good inspiratory effort. ?Abdomen: Soft, mild tenderness to palpation of RUQ.  Nondistended with normal bowel sounds x4  quadrants. ?Muskuloskeletal: No cyanosis, clubbing or edema noted bilaterally ?Neuro: CN II-XII intact, strength 5/5 x 4, sensation, reflexes intact ?Skin: No ulcerative lesions noted or rashes ?Psychiatry: Judgement and insight appear normal. Mood is appropriate for condition and setting ?   ?   ?   ?Labs on Admission:  ?Basic Metabolic Panel: ?Recent Labs  ?Lab 05/14/21 ?2343 05/16/21 ?[id number] 05/16/21 ?1815  ?NA 138 136 135  ?K 4.0 4.0 4.2  ?CL 105 104 101  ?CO2 26 26 25   ?GLUCOSE 110* 120* 144*  ?BUN 28* 19 18  ?CREATININE 1.35* 1.14 1.15  ?CALCIUM 9.4 8.9 8.9  ? ?Liver Function Tests: ?Recent Labs  ?Lab 05/14/21 ?2343 05/16/21 ?[id number] 05/16/21 ?1815  ?AST 18 23 59*  ?ALT 22 27 64*  ?ALKPHOS 89 79 103  ?BILITOT 0.5 0.9 1.1  ?PROT 7.5 7.1 7.5  ?ALBUMIN 4.0 3.8 3.9  ? ?Recent Labs  ?Lab 05/14/21 ?2343 05/16/21 ?1815  ?LIPASE 92* 23  ? ?No results for input(s): AMMONIA in the last 168 hours. ?CBC: ?Recent Labs  ?Lab 05/14/21 ?2343 05/16/21 ?[id number] 05/16/21 ?1958  ?WBC 10.7* 14.6* 24.0*  ?HGB 13.3 13.0  12.4*  ?HCT 41.3 39.5 38.6*  ?MCV 91.4 89.8 89.8  ?PLT 270 219 225  ? ?Cardiac Enzymes: ?No results for input(s): CKTOTAL, CKMB, CKMBINDEX, TROPONINI in the last 168 hours. ? ?BNP (last 3 results) ?No results for input(s): BNP in the last 8760 hours. ? ?ProBNP (last 3 results) ?No results for input(s): PROBNP in the last 8760 hours. ? ?CBG: ?No results for input(s): GLUCAP in the last 168 hours. ? ?Radiological Exams on Admission: ?DG Chest 2 View ? ?Result Date: 05/15/2021 ?CLINICAL DATA:  Chest pain for 2 hours with nausea, initial encounter EXAM: CHEST - 2 VIEW COMPARISON:  04/09/2004 FINDINGS: The heart size and mediastinal contours are within normal limits. Both lungs are clear. The visualized skeletal structures are unremarkable. IMPRESSION: No active cardiopulmonary disease. Electronically Signed   By: Inez Catalina M.D.   On: 05/15/2021 00:12  ? ?US Abdomen Limited ? ?Result Date: 05/15/2021 ?CLINICAL DATA:  71 year old  male with suspected cholelithiasis on CT. EXAM: ULTRASOUND ABDOMEN LIMITED RIGHT UPPER QUADRANT COMPARISON:  CT Chest, Abdomen, and Pelvis 0110 hours today. FINDINGS: Gallbladder: Gallbladder distension similar to the earlie

## 2021-05-17 ENCOUNTER — Inpatient Hospital Stay (HOSPITAL_COMMUNITY): Payer: Medicare PPO

## 2021-05-17 DIAGNOSIS — A419 Sepsis, unspecified organism: Secondary | ICD-10-CM

## 2021-05-17 DIAGNOSIS — I10 Essential (primary) hypertension: Secondary | ICD-10-CM

## 2021-05-17 DIAGNOSIS — R0789 Other chest pain: Secondary | ICD-10-CM | POA: Diagnosis not present

## 2021-05-17 DIAGNOSIS — K810 Acute cholecystitis: Secondary | ICD-10-CM

## 2021-05-17 DIAGNOSIS — N201 Calculus of ureter: Secondary | ICD-10-CM

## 2021-05-17 DIAGNOSIS — K8000 Calculus of gallbladder with acute cholecystitis without obstruction: Secondary | ICD-10-CM

## 2021-05-17 DIAGNOSIS — E782 Mixed hyperlipidemia: Secondary | ICD-10-CM

## 2021-05-17 LAB — CBC
HCT: 37 % — ABNORMAL LOW (ref 39.0–52.0)
Hemoglobin: 12.2 g/dL — ABNORMAL LOW (ref 13.0–17.0)
MCH: 29.5 pg (ref 26.0–34.0)
MCHC: 33 g/dL (ref 30.0–36.0)
MCV: 89.4 fL (ref 80.0–100.0)
Platelets: 249 10*3/uL (ref 150–400)
RBC: 4.14 MIL/uL — ABNORMAL LOW (ref 4.22–5.81)
RDW: 13.2 % (ref 11.5–15.5)
WBC: 26.6 10*3/uL — ABNORMAL HIGH (ref 4.0–10.5)
nRBC: 0 % (ref 0.0–0.2)

## 2021-05-17 LAB — COMPREHENSIVE METABOLIC PANEL
ALT: 55 U/L — ABNORMAL HIGH (ref 0–44)
AST: 34 U/L (ref 15–41)
Albumin: 3.3 g/dL — ABNORMAL LOW (ref 3.5–5.0)
Alkaline Phosphatase: 88 U/L (ref 38–126)
Anion gap: 7 (ref 5–15)
BUN: 14 mg/dL (ref 8–23)
CO2: 24 mmol/L (ref 22–32)
Calcium: 8.4 mg/dL — ABNORMAL LOW (ref 8.9–10.3)
Chloride: 103 mmol/L (ref 98–111)
Creatinine, Ser: 1.16 mg/dL (ref 0.61–1.24)
GFR, Estimated: >60 mL/min (ref >60)
Glucose, Bld: 113 mg/dL — ABNORMAL HIGH (ref 70–99)
Potassium: 4.1 mmol/L (ref 3.5–5.1)
Sodium: 134 mmol/L — ABNORMAL LOW (ref 135–145)
Total Bilirubin: 1 mg/dL (ref 0.3–1.2)
Total Protein: 6.8 g/dL (ref 6.5–8.1)

## 2021-05-17 LAB — PHOSPHORUS: Phosphorus: 2.8 mg/dL (ref 2.5–4.6)

## 2021-05-17 LAB — LACTIC ACID, PLASMA
Lactic Acid, Venous: 1.2 mmol/L (ref 0.5–1.9)
Lactic Acid, Venous: 1 mmol/L (ref 0.5–1.9)

## 2021-05-17 LAB — MAGNESIUM: Magnesium: 1.8 mg/dL (ref 1.7–2.4)

## 2021-05-17 LAB — APTT: aPTT: 37 seconds — ABNORMAL HIGH (ref 24–36)

## 2021-05-17 MED ORDER — IRBESARTAN 150 MG PO TABS
300.0000 mg | ORAL_TABLET | Freq: Every day | ORAL | Status: DC
  Administered 2021-05-17: 300 mg via ORAL
  Filled 2021-05-17 (×2): qty 2

## 2021-05-17 MED ORDER — CHLORHEXIDINE GLUCONATE CLOTH 2 % EX PADS
6.0000 | MEDICATED_PAD | Freq: Once | CUTANEOUS | Status: AC
  Administered 2021-05-17: 6 via TOPICAL

## 2021-05-17 MED ORDER — ACETAMINOPHEN 325 MG PO TABS
650.0000 mg | ORAL_TABLET | Freq: Four times a day (QID) | ORAL | Status: DC | PRN
  Administered 2021-05-18: 650 mg via ORAL
  Filled 2021-05-17: qty 2

## 2021-05-17 MED ORDER — ONDANSETRON HCL 4 MG/2ML IJ SOLN: 4.0000 mg | Freq: Four times a day (QID) | INTRAMUSCULAR | Status: DC | PRN

## 2021-05-17 MED ORDER — ENOXAPARIN SODIUM 40 MG/0.4ML IJ SOSY
40.0000 mg | PREFILLED_SYRINGE | INTRAMUSCULAR | Status: DC
  Administered 2021-05-17: 40 mg via SUBCUTANEOUS
  Filled 2021-05-17 (×3): qty 0.4

## 2021-05-17 MED ORDER — TAMSULOSIN HCL 0.4 MG PO CAPS
0.4000 mg | ORAL_CAPSULE | Freq: Every day | ORAL | Status: DC
  Administered 2021-05-17 – 2021-05-19 (×2): 0.4 mg via ORAL
  Filled 2021-05-17 (×3): qty 1

## 2021-05-17 MED ORDER — ONDANSETRON HCL 4 MG PO TABS: 4.0000 mg | ORAL_TABLET | Freq: Four times a day (QID) | ORAL | Status: DC | PRN

## 2021-05-17 MED ORDER — LACTATED RINGERS IV SOLN
INTRAVENOUS | Status: DC
Start: 2021-05-17 — End: 2021-05-18

## 2021-05-17 NOTE — Assessment & Plan Note (Addendum)
Patient's clinical progression now more suggestive of a calculus cholecystitis with increased WBC and increased LFTs and positive Murphy sign ?Continue IV fluids ?Continue Zosyn ?IV opioids ?05/18/21--lap chole ?

## 2021-05-17 NOTE — Assessment & Plan Note (Signed)
Troponins 7>>8 ?05/15/2021 echo EF 60 to 65%, no WMA ?Personally reviewed EKG--sinus rhythm, no concerning ST-T wave change ?Personally reviewed chest x-ray chronic interstitial markings with mild increase; RLL atelectasis ?

## 2021-05-17 NOTE — Assessment & Plan Note (Addendum)
Holding statin temporarily secondary to elevated LFTs>>restart after d/c ?

## 2021-05-17 NOTE — Assessment & Plan Note (Addendum)
Holding ARB secondary to soft blood pressure initially ?

## 2021-05-17 NOTE — Assessment & Plan Note (Addendum)
Present on admission ?Presented with leukocytosis and tachycardia ?Due to acalculous cholecystitis ?Continued IVF ?Continued zosyn>>d/c home with amox/clav x 7 days ?Sepsis physiology resolved at time of dc ?

## 2021-05-17 NOTE — Assessment & Plan Note (Addendum)
Patient is followed by Dr. Di Kindle ?Right ureteral stent initially placed 02/06/2021 ?Right ESL 02/10/2021 ?03/02/2021 right ureteral stent removal ?Foley removed 02/12/21 ?05/15/2021 CT abdomen and pelvis--6 mm distal right ureteral calculus without hydronephrosis ?will need outpatient follow-up with urology--appreciate Dr. Ronne Binning consult>>no intervention needed at this time ?

## 2021-05-17 NOTE — Progress Notes (Addendum)
Pharmacy Antibiotic Note ? ?Johnny Johns is a 71 y.o. male admitted on 05/16/2021 with  intra-abdominal infection .  Pharmacy has been consulted for Zosyn dosing. WBC trending up (just DC earlier yesterday). Renal function good.  ? ?Plan: ?Zosyn 3.375G IV q8h to be infused over 4 hours ?Trend WBC, temp, renal function  ?F/U infectious work-up ? ?Height: 5\' 10"  (177.8 cm) ?Weight: 109.8 kg (242 lb 1 oz) ?IBW/kg (Calculated) : 73 ? ?Temp (24hrs), Avg:98.4 ?F (36.9 ?C), Min:97.5 ?F (36.4 ?C), Max:99.5 ?F (37.5 ?C) ? ?Recent Labs  ?Lab 05/14/21 ?2343 05/16/21 ?07/16/21 05/16/21 ?1815 05/16/21 ?1958  ?WBC 10.7* 14.6*  --  24.0*  ?CREATININE 1.35* 1.14 1.15  --   ?  ?Estimated Creatinine Clearance: 71 mL/min (by C-G formula based on SCr of 1.15 mg/dL).   ? ?Allergies  ?Allergen Reactions  ? Gemfibrozil   ? Metoprolol Other (See Comments)  ?  Myalgias and neck pain  ? Simvastatin Other (See Comments)  ?  Myalgias; also Vytorin, pravastatin and Lipitor  ? Statins Other (See Comments)  ?  Muscle aches  ? Vioxx [Rofecoxib]   ? ? ?07/16/21, PharmD, BCPS ?Clinical Pharmacist ?Phone: [phone] ? ?

## 2021-05-17 NOTE — Progress Notes (Addendum)
?  ?       ?PROGRESS NOTE ? ?Johnny Johns [id number] DOB: [birth year] DOA: 05/16/2021 ?PCP: Asencion Noble, MD ? ?Brief History:  ?71 year old male with a history of coronary artery disease, chronic neck pain status post anterior cervical discectomy 05/01/2021, hyperlipidemia, hypertension, peptic ulcer disease, IBS presenting with epigastric pain.  The patient was recently admitted to the hospital from 05/14/2021 through the 05/16/2021 with chest pain and upper abdominal pain.  His EKGs have been reassuring.  Echocardiogram on 05/15/2021 showed normal EF with no WMA.  He was seen by cardiology.  It was felt that his chest discomfort was noncardiac.  The patient had a CTA of the chest, abdomen, and pelvis which did not show any aortic aneurysm or dissection.  It did show some cholelithiasis.  There was centrilobular emphysema.  There was a retained 6 mm distal right ureteral stone without any hydronephrosis.  Right upper quadrant ultrasound on 05/15/2021 showed a distended gallbladder with mild wall thickening and some pericholecystic fluid.  His LFTs were normal.  The patient was seen by general surgery.  They recommended a HIDA scan.  The patient did not want to stay in the hospital for the weekend, and he was discharged home in stable condition.  When he returned home, the patient had an increase in his upper abdominal pain, particularly after eating some food.  He denied any fevers, chills, shortness breath, nausea, vomiting, diarrhea.  He took some Pepto-Bismol and acetaminophen without any improvement.  Because of his increased abdominal pain, the patient return for further evaluation and treatment. ?In the ED, the patient had a low-grade temperature of 100.0 ?F.  He was tachycardic up into the 110s.  He was hemodynamically stable with oxygen saturation 95% room air.  BMP showed sodium 135, potassium 4.2, serum creatinine 1.5.  AST 59, ALT 64, alkaline phosphatase 103, total bilirubin 1.1.  Lipase was 23.  EKG shows  sinus rhythm without concerning ST-T wave changes.  Chest x-ray showed chronic interstitial markings.  The patient was started on Zosyn, IV fluids, and IV opioids.  General surgery was reconsulted.  ? ? ? ?Assessment and Plan: ?* Sepsis due to undetermined organism [facility]) ?Present on admission ?Presented with leukocytosis and tachycardia ?Due to acalculous cholecystitis ?Continue IVF ?Continue zosyn ?Check lactate ? ?Acute acalculous cholecystitis ?Patient's clinical progression now more suggestive of a calculus cholecystitis with increased WBC and increased LFTs and positive Murphy sign ?Continue IV fluids ?Continue Zosyn ?IV opioids ?General surgery consult ? ?Atypical chest pain ?Troponins 7>>8 ?05/15/2021 echo EF 60 to 65%, no WMA ?Personally reviewed EKG--sinus rhythm, no concerning ST-T wave change ?Personally reviewed chest x-ray chronic interstitial markings with mild increase; RLL atelectasis ? ?Right ureteral calculus ?Patient is followed by Dr. Michaelle Birks ?Right ureteral stent initially placed 02/06/2021 ?Right ESL 02/10/2021 ?03/02/2021 right ureteral stent removal ?Foley removed 02/12/21 ?05/15/2021 CT abdomen and pelvis--6 mm distal right ureteral calculus without hydronephrosis ?will need outpatient follow-up with urology ?Obtain UA ?Renal function stable ? ?Essential hypertension ?Holding ARB secondary to soft blood pressure ? ?BPH (benign prostatic hyperplasia) ?Continue tamsulosin ? ?Mixed hyperlipidemia ?Holding statin temporarily secondary to elevated LFTs ? ? ? ? ?Family Communication:   no Family at bedside ? ?Consultants:  general surgery ? ?Code Status:  FULL  ? ?DVT Prophylaxis:  [place] Lovenox ? ? ?Procedures: ?As Listed in Progress Note Above ? ?Antibiotics: ?Zosyn 5/6>> ? ? ? ? ?Subjective: ?Patient complains of right upper quadrant pain.  He denies any chest  pain, shortness breath, coughing, hemoptysis, nausea, vomiting, diarrhea ? ?Objective: ?Vitals:  ? 05/17/21 0249 05/17/21 0300 05/17/21  0450 05/17/21 0654  ?BP: 112/74  111/73 105/81  ?Pulse: (!) 118 (!) 114 (!) 108 (!) 116  ?Resp: 18  19 20   ?Temp: 99.5 ?F (37.5 ?C)  100 ?F (37.8 ?C) 98.7 ?F (37.1 ?C)  ?TempSrc: Oral  Oral Oral  ?SpO2: 91%  93% 92%  ?Weight:      ?Height:      ? ? ?Intake/Output Summary (Last 24 hours) at 05/17/2021 0737 ?Last data filed at 05/17/2021 [id number] ?Gross per 24 hour  ?Intake 948.3 ml  ?Output --  ?Net 948.3 ml  ? ?Weight change:  ?Exam: ? ?General:  Pt is alert, follows commands appropriately, not in acute distress ?HEENT: No icterus, No thrush, No neck mass, [place]/AT ?Cardiovascular: RRR, S1/S2, no rubs, no gallops ?Respiratory: Right basilar crackles.  No wheezing.  Good air movement ?Abdomen: Soft/+BS, RUQ tender, non distended, no guarding ?Extremities: No edema, No lymphangitis, No petechiae, No rashes, no synovitis ? ? ?Data Reviewed: ?I have personally reviewed following labs and imaging studies ?Basic Metabolic Panel: ?Recent Labs  ?Lab 05/14/21 ?2343 05/16/21 ?[id number] 05/16/21 ?1815 05/17/21 ?0457  ?NA 138 136 135 134*  ?K 4.0 4.0 4.2 4.1  ?CL 105 104 101 103  ?CO2 26 26 25 24   ?GLUCOSE 110* 120* 144* 113*  ?BUN 28* 19 18 14   ?CREATININE 1.35* 1.14 1.15 1.16  ?CALCIUM 9.4 8.9 8.9 8.4*  ?MG  --   --   --  1.8  ?PHOS  --   --   --  2.8  ? ?Liver Function Tests: ?Recent Labs  ?Lab 05/14/21 ?2343 05/16/21 ?[id number] 05/16/21 ?1815 05/17/21 ?0457  ?AST 18 23 59* 34  ?ALT 22 27 64* 55*  ?ALKPHOS 89 79 103 88  ?BILITOT 0.5 0.9 1.1 1.0  ?PROT 7.5 7.1 7.5 6.8  ?ALBUMIN 4.0 3.8 3.9 3.3*  ? ?Recent Labs  ?Lab 05/14/21 ?2343 05/16/21 ?1815  ?LIPASE 92* 23  ? ?No results for input(s): AMMONIA in the last 168 hours. ?Coagulation Profile: ?No results for input(s): INR, PROTIME in the last 168 hours. ?CBC: ?Recent Labs  ?Lab 05/14/21 ?2343 05/16/21 ?[id number] 05/16/21 ?1958 05/17/21 ?0457  ?WBC 10.7* 14.6* 24.0* 26.6*  ?HGB 13.3 13.0 12.4* 12.2*  ?HCT 41.3 39.5 38.6* 37.0*  ?MCV 91.4 89.8 89.8 89.4  ?PLT 270 219 225 249  ? ?Cardiac Enzymes: ?No  results for input(s): CKTOTAL, CKMB, CKMBINDEX, TROPONINI in the last 168 hours. ?BNP: ?Invalid input(s): POCBNP ?CBG: ?No results for input(s): GLUCAP in the last 168 hours. ?HbA1C: ?No results for input(s): HGBA1C in the last 72 hours. ?Urine analysis: ?   ?Component Value Date/Time  ? COLORURINE YELLOW 02/07/2021 0315  ? APPEARANCEUR Clear 04/13/2021 1003  ? LABSPEC 1.025 02/07/2021 0315  ? PHURINE 6.5 02/07/2021 0315  ? GLUCOSEU Negative 04/13/2021 1003  ? HGBUR LARGE (A) 02/07/2021 0315  ? BILIRUBINUR Negative 04/13/2021 1003  ? [place] NEGATIVE 02/07/2021 0315  ? PROTEINUR Trace (A) 04/13/2021 1003  ? PROTEINUR >300 (A) 02/07/2021 0315  ? NITRITE Negative 04/13/2021 1003  ? NITRITE POSITIVE (A) 02/07/2021 0315  ? LEUKOCYTESUR Trace (A) 04/13/2021 1003  ? LEUKOCYTESUR SMALL (A) 02/07/2021 0315  ? ?Sepsis Labs: ?@LABRCNTIP (procalcitonin:4,lacticidven:4) ?) ?Recent Results (from the past 240 hour(s))  ?MRSA Next Gen by PCR, Nasal     Status: None  ? Collection Time: 05/15/21  7:17 AM  ? Specimen: Nasal Mucosa; Nasal Swab  ?Result  Value Ref Range Status  ? MRSA by PCR Next Gen NOT DETECTED NOT DETECTED Final  ?  Comment: (NOTE) ?The GeneXpert MRSA Assay (FDA approved for NASAL specimens only), ?is one component of a comprehensive MRSA colonization surveillance ?program. It is not intended to diagnose MRSA infection nor to guide ?or monitor treatment for MRSA infections. ?Test performance is not FDA approved in patients less than 2 years ?old. ?Performed at [facility], [address], [place], [place] [address] ?  ?  ? ?Scheduled Meds: ? enoxaparin (LOVENOX) injection  40 mg Subcutaneous Q24H  ? irbesartan  300 mg Oral Daily  ? pantoprazole (PROTONIX) IV  40 mg Intravenous Q12H  ? tamsulosin  0.4 mg Oral Daily  ? ?Continuous Infusions: ? lactated ringers    ? piperacillin-tazobactam (ZOSYN)  IV 3.375 g (05/17/21 0550)  ? ? ?Procedures/Studies: ?DG Chest 2 View ? ?Result Date: 05/15/2021 ?CLINICAL DATA:  Chest pain  for 2 hours with nausea, initial encounter EXAM: CHEST - 2 VIEW COMPARISON:  04/09/2004 FINDINGS: The heart size and mediastinal contours are within normal limits. Both lungs are clear. The visualized skeletal

## 2021-05-17 NOTE — Hospital Course (Signed)
70 year old male with a history of coronary artery disease, chronic neck pain status post anterior cervical discectomy 05/01/2021, hyperlipidemia, hypertension, peptic ulcer disease, IBS presenting with epigastric pain.  The patient was recently admitted to the hospital from 05/14/2021 through the 05/16/2021 with chest pain and upper abdominal pain.  His EKGs have been reassuring.  Echocardiogram on 05/15/2021 showed normal EF with no WMA.  He was seen by cardiology.  It was felt that his chest discomfort was noncardiac.  The patient had a CTA of the chest, abdomen, and pelvis which did not show any aortic aneurysm or dissection.  It did show some cholelithiasis.  There was centrilobular emphysema.  There was a retained 6 mm distal right ureteral stone without any hydronephrosis.  Right upper quadrant ultrasound on 05/15/2021 showed a distended gallbladder with mild wall thickening and some pericholecystic fluid.  His LFTs were normal.  The patient was seen by general surgery.  They recommended a HIDA scan.  The patient did not want to stay in the hospital for the weekend, and he was discharged home in stable condition.  When he returned home, the patient had an increase in his upper abdominal pain, particularly after eating some food.  He denied any fevers, chills, shortness breath, nausea, vomiting, diarrhea.  He took some Pepto-Bismol and acetaminophen without any improvement.  Because of his increased abdominal pain, the patient return for further evaluation and treatment. ?In the ED, the patient had a low-grade temperature of 100.0 ?F.  He was tachycardic up into the 110s.  He was hemodynamically stable with oxygen saturation 95% room air.  BMP showed sodium 135, potassium 4.2, serum creatinine 1.5.  AST 59, ALT 64, alkaline phosphatase 103, total bilirubin 1.1.  Lipase was 23.  EKG shows sinus rhythm without concerning ST-T wave changes.  Chest x-ray showed chronic interstitial markings.  The patient was started on  Zosyn, IV fluids, and IV opioids.  General surgery was reconsulted. ?

## 2021-05-17 NOTE — Assessment & Plan Note (Signed)
Continue tamsulosin.

## 2021-05-17 NOTE — Consult Note (Signed)
Reason for Consult: Right upper quadrant abdominal pain ?Referring Physician: Dr. Tat ? ?Johnny Johns is an 71 y.o. male.  ?HPI: Patient is a 71-year-old white male who I saw yesterday for epigastric/chest pain.  Initially it was thought he may have had biliary colic secondary to a calculus cholecystitis.  He tolerated lunch well and was discharged home.  He returned to the emergency room yesterday evening with worsening chest pain and subsequently right upper quadrant abdominal pain.  A CT scan of the chest and abdomen revealed a possible gallstone in the neck of the gallbladder.  His LFTs were also mildly elevated.  He was admitted to the hospital for pain control and IV antibiotics.  This morning, he states he feels a little bit better but still has right upper quadrant abdominal pain.  He is noted to have a leukocytosis, worsening since discharge yesterday. ? ?Past Medical History:  ?Diagnosis Date  ? 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 Presented with acute myocardial infarction In 01/2003; isolated 90% proximal LAD lesion treated with DES; EF 45-50%  ? Degenerative joint disease   ? Chronic neck and back pain  ? Hyperlipidemia   ? lipid profile in 06/2010:195, 207, 37, 117  ? Hypertension   ? IBS (irritable bowel syndrome)   ? Myocardial infarction (HCC)   ? PUD (peptic ulcer disease)   ? hx  ? Tobacco abuse, in remission   ? ? ?Past Surgical History:  ?Procedure Laterality Date  ? CARDIAC CATHETERIZATION    ? stent-2005  ? COLONOSCOPY  01/11/2006  ? CYSTOSCOPY W/ URETERAL STENT PLACEMENT Right 02/06/2021  ? Procedure: CYSTOSCOPY WITH RIGHT RETROGRADE PYELOGRAM/ RIGHT URETERAL STENT PLACEMENT/ RIGHT URETEROSCOPY;  Surgeon: Stoneking, Bradley J., MD;  Location: [facility];  Service: Urology;  Laterality: Right;  ? EXTRACORPOREAL SHOCK WAVE LITHOTRIPSY Right 02/10/2021  ? Procedure: EXTRACORPOREAL SHOCK WAVE LITHOTRIPSY (ESWL);  Surgeon: Stoneking, Bradley J., MD;  Location: [facility];  Service:  Urology;  Laterality: Right;  ? KIDNEY STONE SURGERY  01/12/1988  ? ? ?No family history on file. ? ?Social History:  reports that he quit smoking about 18 years ago. His smoking use included cigarettes. He has a 30.00 pack-year smoking history. He has never used smokeless tobacco. He reports that he does not drink alcohol and does not use drugs. ? ?Allergies:  ?Allergies  ?Allergen Reactions  ? Gemfibrozil   ? Metoprolol Other (See Comments)  ?  Myalgias and neck pain  ? Simvastatin Other (See Comments)  ?  Myalgias; also Vytorin, pravastatin and Lipitor  ? Statins Other (See Comments)  ?  Muscle aches  ? Vioxx [Rofecoxib]   ? ? ?Medications: I have reviewed the patient's current medications. ? ?Results for orders placed or performed during the hospital encounter of 05/16/21 (from the past 48 hour(s))  ?Basic metabolic panel     Status: Abnormal  ? Collection Time: 05/16/21  6:15 PM  ?Result Value Ref Range  ? Sodium 135 135 - 145 mmol/L  ? Potassium 4.2 3.5 - 5.1 mmol/L  ? Chloride 101 98 - 111 mmol/L  ? CO2 25 22 - 32 mmol/L  ? Glucose, Bld 144 (H) 70 - 99 mg/dL  ?  Comment: Glucose reference range applies only to samples taken after fasting for at least 8 hours.  ? BUN 18 8 - 23 mg/dL  ? Creatinine, Ser 1.15 0.61 - 1.24 mg/dL  ? Calcium 8.9 8.9 - 10.3 mg/dL  ? GFR, Estimated >60 >60 mL/min  ?    Comment: (NOTE) ?Calculated using the CKD-EPI Creatinine Equation (2021) ?  ? Anion gap 9 5 - 15  ?  Comment: Performed at [facility], [address] [place], [place] [address]  ?Troponin I (High Sensitivity)     Status: None  ? Collection Time: 05/16/21  6:15 PM  ?Result Value Ref Range  ? Troponin I (High Sensitivity) 7 <18 ng/L  ?  Comment: (NOTE) ?Elevated high sensitivity troponin I (hsTnI) values and significant  ?changes across serial measurements may suggest ACS but many other  ?chronic and acute conditions are known to elevate hsTnI results.  ?Refer to the "Links" section for chest pain algorithms and additional   ?guidance. ?Performed at [facility], [address] [place], [place] [address] ?  ?Hepatic function panel     Status: Abnormal  ? Collection Time: 05/16/21  6:15 PM  ?Result Value Ref Range  ? Total Protein 7.5 6.5 - 8.1 g/dL  ? Albumin 3.9 3.5 - 5.0 g/dL  ? AST 59 (H) 15 - 41 U/L  ? ALT 64 (H) 0 - 44 U/L  ? Alkaline Phosphatase 103 38 - 126 U/L  ? Total Bilirubin 1.1 0.3 - 1.2 mg/dL  ? Bilirubin, Direct 0.3 (H) 0.0 - 0.2 mg/dL  ? Indirect Bilirubin 0.8 0.3 - 0.9 mg/dL  ?  Comment: Performed at [facility], [address] [place], [place] [address]  ?Lipase, blood     Status: None  ? Collection Time: 05/16/21  6:15 PM  ?Result Value Ref Range  ? Lipase 23 11 - 51 U/L  ?  Comment: Performed at [facility], [address] [place], [place] [address]  ?CBC     Status: Abnormal  ? Collection Time: 05/16/21  7:58 PM  ?Result Value Ref Range  ? WBC 24.0 (H) 4.0 - 10.5 K/uL  ? RBC 4.30 4.22 - 5.81 MIL/uL  ? Hemoglobin 12.4 (L) 13.0 - 17.0 g/dL  ? HCT 38.6 (L) 39.0 - 52.0 %  ? MCV 89.8 80.0 - 100.0 fL  ? MCH 28.8 26.0 - 34.0 pg  ? MCHC 32.1 30.0 - 36.0 g/dL  ? RDW 13.2 11.5 - 15.5 %  ? Platelets 225 150 - 400 K/uL  ? nRBC 0.0 0.0 - 0.2 %  ?  Comment: Performed at [facility], [address] [place], [place] [address]  ?Troponin I (High Sensitivity)     Status: None  ? Collection Time: 05/16/21  7:58 PM  ?Result Value Ref Range  ? Troponin I (High Sensitivity) 8 <18 ng/L  ?  Comment: (NOTE) ?Elevated high sensitivity troponin I (hsTnI) values and significant  ?changes across serial measurements may suggest ACS but many other  ?chronic and acute conditions are known to elevate hsTnI results.  ?Refer to the "Links" section for chest pain algorithms and additional  ?guidance. ?Performed at [facility], [address] [place], [place] [address] ?  ?Comprehensive metabolic panel     Status: Abnormal  ? Collection Time: 05/17/21  4:57 AM  ?Result Value Ref Range  ? Sodium 134 (L) 135 - 145 mmol/L  ? Potassium 4.1 3.5 - 5.1  mmol/L  ? Chloride 103 98 - 111 mmol/L  ? CO2 24 22 - 32 mmol/L  ? Glucose, Bld 113 (H) 70 - 99 mg/dL  ?  Comment: Glucose reference range applies only to samples taken after fasting for at least 8 hours.  ? BUN 14 8 - 23 mg/dL  ? Creatinine, Ser 1.16 0.61 - 1.24   mg/dL  ? Calcium 8.4 (L) 8.9 - 10.3 mg/dL  ? Total Protein 6.8 6.5 - 8.1 g/dL  ? Albumin 3.3 (L) 3.5 - 5.0 g/dL  ? AST 34 15 - 41 U/L  ? ALT 55 (H) 0 - 44 U/L  ? Alkaline Phosphatase 88 38 - 126 U/L  ? Total Bilirubin 1.0 0.3 - 1.2 mg/dL  ? GFR, Estimated >60 >60 mL/min  ?  Comment: (NOTE) ?Calculated using the CKD-EPI Creatinine Equation (2021) ?  ? Anion gap 7 5 - 15  ?  Comment: Performed at [facility], [address] [place], [place] [address]  ?CBC     Status: Abnormal  ? Collection Time: 05/17/21  4:57 AM  ?Result Value Ref Range  ? WBC 26.6 (H) 4.0 - 10.5 K/uL  ? RBC 4.14 (L) 4.22 - 5.81 MIL/uL  ? Hemoglobin 12.2 (L) 13.0 - 17.0 g/dL  ? HCT 37.0 (L) 39.0 - 52.0 %  ? MCV 89.4 80.0 - 100.0 fL  ? MCH 29.5 26.0 - 34.0 pg  ? MCHC 33.0 30.0 - 36.0 g/dL  ? RDW 13.2 11.5 - 15.5 %  ? Platelets 249 150 - 400 K/uL  ? nRBC 0.0 0.0 - 0.2 %  ?  Comment: Performed at [facility], [address] [place], [place] [address]  ?APTT     Status: Abnormal  ? Collection Time: 05/17/21  4:57 AM  ?Result Value Ref Range  ? aPTT 37 (H) 24 - 36 seconds  ?  Comment:        ?IF BASELINE aPTT IS ELEVATED, ?SUGGEST PATIENT RISK ASSESSMENT ?BE USED TO DETERMINE APPROPRIATE ?ANTICOAGULANT THERAPY. ?Performed at [facility], [address] [place], [place] [address] ?  ?Magnesium     Status: None  ? Collection Time: 05/17/21  4:57 AM  ?Result Value Ref Range  ? Magnesium 1.8 1.7 - 2.4 mg/dL  ?  Comment: Performed at [facility], [address] [place],  [address]  ?Phosphorus     Status: None  ? Collection Time: 05/17/21  4:57 AM  ?Result Value Ref Range  ? Phosphorus 2.8 2.5 - 4.6 mg/dL  ?  Comment: Performed at [facility], [address] ,  [address]   ?Lactic acid, plasma     Status: None  ? Collection Time: 05/17/21  8:06 AM  ?Result Value Ref Range  ? Lactic Acid, Venous 1.2 0.5 - 1.9 mmol/L  ?  Comment: Performed at [facility], [address]

## 2021-05-17 NOTE — Progress Notes (Signed)
?   05/17/21 0249  ?Assess: MEWS Score  ?Temp 99.5 ?F (37.5 ?C)  ?BP 112/74  ?Pulse Rate (!) 118  ?Resp 18  ?Level of Consciousness Alert  ?SpO2 91 %  ?O2 Device Nasal Cannula  ?Assess: MEWS Score  ?MEWS Temp 0  ?MEWS Systolic 0  ?MEWS Pulse 2  ?MEWS RR 0  ?MEWS LOC 0  ?MEWS Score 2  ?MEWS Score Color Yellow  ?Assess: if the MEWS score is Yellow or Red  ?Were vital signs taken at a resting state? Yes  ?Focused Assessment Change from prior assessment (see assessment flowsheet)  ?Early Detection of Sepsis Score *See Row Information* Low  ?MEWS guidelines implemented *See Row Information* Yes  ?Treat  ?MEWS Interventions Escalated (See documentation below)  ?Pain Scale 0-10  ?Pain Score 0  ?Take Vital Signs  ?Increase Vital Sign Frequency  Yellow: Q 2hr X 2 then Q 4hr X 2, if remains yellow, continue Q 4hrs  ?Escalate  ?MEWS: Escalate Yellow: discuss with charge nurse/RN and consider discussing with provider and RRT  ?Notify: Charge Nurse/RN  ?Name of Charge Nurse/RN Notified Felida, RN  ?Date Charge Nurse/RN Notified 05/17/21  ?Time Charge Nurse/RN Notified [phone]  ?Notify: Provider  ?Provider Name/Title Thomes Dinning, MD  ?Date Provider Notified 05/17/21  ?Time Provider Notified [phone]  ?Notification Type Page  ?Notification Reason Other (Comment) ?(Yellow MEWS)  ?Provider response Evaluate remotely  ? ? ?

## 2021-05-17 NOTE — H&P (View-Only) (Signed)
Reason for Consult: Right upper quadrant abdominal pain ?Referring Physician: Dr. Carles Collet ? ?Johnny Johns is an 71 y.o. male.  ?HPI: Patient is a 71 year old white male who I saw yesterday for epigastric/chest pain.  Initially it was thought he may have had biliary colic secondary to a calculus cholecystitis.  He tolerated lunch well and was discharged home.  He returned to the emergency room yesterday evening with worsening chest pain and subsequently right upper quadrant abdominal pain.  A CT scan of the chest and abdomen revealed a possible gallstone in the neck of the gallbladder.  His LFTs were also mildly elevated.  He was admitted to the hospital for pain control and IV antibiotics.  This morning, he states he feels a little bit better but still has right upper quadrant abdominal pain.  He is noted to have a leukocytosis, worsening since discharge yesterday. ? ?Past Medical History:  ?Diagnosis Date  ? 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 Presented with acute myocardial infarction In 01/2003; isolated 90% proximal LAD lesion treated with DES; EF 45-50%  ? Degenerative joint disease   ? Chronic neck and back pain  ? Hyperlipidemia   ? lipid profile in 06/2010:195, 207, 37, 117  ? Hypertension   ? IBS (irritable bowel syndrome)   ? Myocardial infarction [facility])   ? PUD (peptic ulcer disease)   ? hx  ? Tobacco abuse, in remission   ? ? ?Past Surgical History:  ?Procedure Laterality Date  ? CARDIAC CATHETERIZATION    ? stent-2005  ? COLONOSCOPY  01/11/2006  ? CYSTOSCOPY W/ URETERAL STENT PLACEMENT Right 02/06/2021  ? Procedure: CYSTOSCOPY WITH RIGHT RETROGRADE PYELOGRAM/ RIGHT URETERAL STENT PLACEMENT/ RIGHT URETEROSCOPY;  Surgeon: Primus Bravo., MD;  Location: [facility];  Service: Urology;  Laterality: Right;  ? EXTRACORPOREAL SHOCK WAVE LITHOTRIPSY Right 02/10/2021  ? Procedure: EXTRACORPOREAL SHOCK WAVE LITHOTRIPSY (ESWL);  Surgeon: Primus Bravo., MD;  Location: [facility];  Service:  Urology;  Laterality: Right;  ? KIDNEY STONE SURGERY  01/12/1988  ? ? ?No family history on file. ? ?Social History:  reports that he quit smoking about 18 years ago. His smoking use included cigarettes. He has a 30.00 pack-year smoking history. He has never used smokeless tobacco. He reports that he does not drink alcohol and does not use drugs. ? ?Allergies:  ?Allergies  ?Allergen Reactions  ? Gemfibrozil   ? Metoprolol Other (See Comments)  ?  Myalgias and neck pain  ? Simvastatin Other (See Comments)  ?  Myalgias; also Vytorin, pravastatin and Lipitor  ? Statins Other (See Comments)  ?  Muscle aches  ? Vioxx [Rofecoxib]   ? ? ?Medications: I have reviewed the patient's current medications. ? ?Results for orders placed or performed during the hospital encounter of 05/16/21 (from the past 48 hour(s))  ?Basic metabolic panel     Status: Abnormal  ? Collection Time: 05/16/21  6:15 PM  ?Result Value Ref Range  ? Sodium 135 135 - 145 mmol/L  ? Potassium 4.2 3.5 - 5.1 mmol/L  ? Chloride 101 98 - 111 mmol/L  ? CO2 25 22 - 32 mmol/L  ? Glucose, Bld 144 (H) 70 - 99 mg/dL  ?  Comment: Glucose reference range applies only to samples taken after fasting for at least 8 hours.  ? BUN 18 8 - 23 mg/dL  ? Creatinine, Ser 1.15 0.61 - 1.24 mg/dL  ? Calcium 8.9 8.9 - 10.3 mg/dL  ? GFR, Estimated >60 >60 mL/min  ?  Comment: (NOTE) ?Calculated using the CKD-EPI Creatinine Equation (2021) ?  ? Anion gap 9 5 - 15  ?  Comment: Performed at [facility], [address], [place], [place] [address]  ?Troponin I (High Sensitivity)     Status: None  ? Collection Time: 05/16/21  6:15 PM  ?Result Value Ref Range  ? Troponin I (High Sensitivity) 7 <18 ng/L  ?  Comment: (NOTE) ?Elevated high sensitivity troponin I (hsTnI) values and significant  ?changes across serial measurements may suggest ACS but many other  ?chronic and acute conditions are known to elevate hsTnI results.  ?Refer to the "Links" section for chest pain algorithms and additional   ?guidance. ?Performed at [facility], [address], [place], [place] [address] ?  ?Hepatic function panel     Status: Abnormal  ? Collection Time: 05/16/21  6:15 PM  ?Result Value Ref Range  ? Total Protein 7.5 6.5 - 8.1 g/dL  ? Albumin 3.9 3.5 - 5.0 g/dL  ? AST 59 (H) 15 - 41 U/L  ? ALT 64 (H) 0 - 44 U/L  ? Alkaline Phosphatase 103 38 - 126 U/L  ? Total Bilirubin 1.1 0.3 - 1.2 mg/dL  ? Bilirubin, Direct 0.3 (H) 0.0 - 0.2 mg/dL  ? Indirect Bilirubin 0.8 0.3 - 0.9 mg/dL  ?  Comment: Performed at [facility], [address], [place], [place] [address]  ?Lipase, blood     Status: None  ? Collection Time: 05/16/21  6:15 PM  ?Result Value Ref Range  ? Lipase 23 11 - 51 U/L  ?  Comment: Performed at [facility], [address] [place], [place] [address]  ?CBC     Status: Abnormal  ? Collection Time: 05/16/21  7:58 PM  ?Result Value Ref Range  ? WBC 24.0 (H) 4.0 - 10.5 K/uL  ? RBC 4.30 4.22 - 5.81 MIL/uL  ? Hemoglobin 12.4 (L) 13.0 - 17.0 g/dL  ? HCT 38.6 (L) 39.0 - 52.0 %  ? MCV 89.8 80.0 - 100.0 fL  ? MCH 28.8 26.0 - 34.0 pg  ? MCHC 32.1 30.0 - 36.0 g/dL  ? RDW 13.2 11.5 - 15.5 %  ? Platelets 225 150 - 400 K/uL  ? nRBC 0.0 0.0 - 0.2 %  ?  Comment: Performed at [facility], [address] [place], [place] [address]  ?Troponin I (High Sensitivity)     Status: None  ? Collection Time: 05/16/21  7:58 PM  ?Result Value Ref Range  ? Troponin I (High Sensitivity) 8 <18 ng/L  ?  Comment: (NOTE) ?Elevated high sensitivity troponin I (hsTnI) values and significant  ?changes across serial measurements may suggest ACS but many other  ?chronic and acute conditions are known to elevate hsTnI results.  ?Refer to the "Links" section for chest pain algorithms and additional  ?guidance. ?Performed at [facility], [address] [place],  [address] ?  ?Comprehensive metabolic panel     Status: Abnormal  ? Collection Time: 05/17/21  4:57 AM  ?Result Value Ref Range  ? Sodium 134 (L) 135 - 145 mmol/L  ? Potassium 4.1 3.5 - 5.1  mmol/L  ? Chloride 103 98 - 111 mmol/L  ? CO2 24 22 - 32 mmol/L  ? Glucose, Bld 113 (H) 70 - 99 mg/dL  ?  Comment: Glucose reference range applies only to samples taken after fasting for at least 8 hours.  ? BUN 14 8 - 23 mg/dL  ? Creatinine, Ser 1.16 0.61 - 1.24  mg/dL  ? Calcium 8.4 (L) 8.9 - 10.3 mg/dL  ? Total Protein 6.8 6.5 - 8.1 g/dL  ? Albumin 3.3 (L) 3.5 - 5.0 g/dL  ? AST 34 15 - 41 U/L  ? ALT 55 (H) 0 - 44 U/L  ? Alkaline Phosphatase 88 38 - 126 U/L  ? Total Bilirubin 1.0 0.3 - 1.2 mg/dL  ? GFR, Estimated >60 >60 mL/min  ?  Comment: (NOTE) ?Calculated using the CKD-EPI Creatinine Equation (2021) ?  ? Anion gap 7 5 - 15  ?  Comment: Performed at [facility], [address] [place], [place] [address]  ?CBC     Status: Abnormal  ? Collection Time: 05/17/21  4:57 AM  ?Result Value Ref Range  ? WBC 26.6 (H) 4.0 - 10.5 K/uL  ? RBC 4.14 (L) 4.22 - 5.81 MIL/uL  ? Hemoglobin 12.2 (L) 13.0 - 17.0 g/dL  ? HCT 37.0 (L) 39.0 - 52.0 %  ? MCV 89.4 80.0 - 100.0 fL  ? MCH 29.5 26.0 - 34.0 pg  ? MCHC 33.0 30.0 - 36.0 g/dL  ? RDW 13.2 11.5 - 15.5 %  ? Platelets 249 150 - 400 K/uL  ? nRBC 0.0 0.0 - 0.2 %  ?  Comment: Performed at [facility], [address], [place], [place] [address]  ?APTT     Status: Abnormal  ? Collection Time: 05/17/21  4:57 AM  ?Result Value Ref Range  ? aPTT 37 (H) 24 - 36 seconds  ?  Comment:        ?IF BASELINE aPTT IS ELEVATED, ?SUGGEST PATIENT RISK ASSESSMENT ?BE USED TO DETERMINE APPROPRIATE ?ANTICOAGULANT THERAPY. ?Performed at [facility], [address] [place], [place] [address] ?  ?Magnesium     Status: None  ? Collection Time: 05/17/21  4:57 AM  ?Result Value Ref Range  ? Magnesium 1.8 1.7 - 2.4 mg/dL  ?  Comment: Performed at [facility], [address] [place], [place] [address]  ?Phosphorus     Status: None  ? Collection Time: 05/17/21  4:57 AM  ?Result Value Ref Range  ? Phosphorus 2.8 2.5 - 4.6 mg/dL  ?  Comment: Performed at [facility], [address], [place], [place] [address]   ?Lactic acid, plasma     Status: None  ? Collection Time: 05/17/21  8:06 AM  ?Result Value Ref Range  ? Lactic Acid, Venous 1.2 0.5 - 1.9 mmol/L  ?  Comment: Performed at [facility], [place], [place]

## 2021-05-18 ENCOUNTER — Other Ambulatory Visit: Payer: Self-pay

## 2021-05-18 ENCOUNTER — Encounter (HOSPITAL_COMMUNITY)
Admission: EM | Disposition: A | Discharge status: Home / Self Care | Payer: Self-pay | Source: Home / Self Care | Attending: Internal Medicine

## 2021-05-18 ENCOUNTER — Inpatient Hospital Stay (HOSPITAL_COMMUNITY): Payer: Medicare PPO

## 2021-05-18 ENCOUNTER — Inpatient Hospital Stay (HOSPITAL_COMMUNITY): Payer: Medicare PPO | Admitting: Anesthesiology

## 2021-05-18 ENCOUNTER — Encounter (HOSPITAL_COMMUNITY): Payer: Self-pay | Admitting: Internal Medicine

## 2021-05-18 DIAGNOSIS — N179 Acute kidney failure, unspecified: Secondary | ICD-10-CM

## 2021-05-18 DIAGNOSIS — K8000 Calculus of gallbladder with acute cholecystitis without obstruction: Secondary | ICD-10-CM

## 2021-05-18 DIAGNOSIS — A419 Sepsis, unspecified organism: Principal | ICD-10-CM

## 2021-05-18 DIAGNOSIS — N201 Calculus of ureter: Secondary | ICD-10-CM | POA: Diagnosis not present

## 2021-05-18 DIAGNOSIS — R0789 Other chest pain: Secondary | ICD-10-CM | POA: Diagnosis not present

## 2021-05-18 DIAGNOSIS — I252 Old myocardial infarction: Secondary | ICD-10-CM

## 2021-05-18 DIAGNOSIS — I129 Hypertensive chronic kidney disease with stage 1 through stage 4 chronic kidney disease, or unspecified chronic kidney disease: Secondary | ICD-10-CM

## 2021-05-18 DIAGNOSIS — N1831 Chronic kidney disease, stage 3a: Secondary | ICD-10-CM

## 2021-05-18 DIAGNOSIS — K810 Acute cholecystitis: Secondary | ICD-10-CM | POA: Diagnosis not present

## 2021-05-18 HISTORY — PX: CHOLECYSTECTOMY: SHX55

## 2021-05-18 LAB — CBC
HCT: 34.2 % — ABNORMAL LOW (ref 39.0–52.0)
Hemoglobin: 11.3 g/dL — ABNORMAL LOW (ref 13.0–17.0)
MCH: 30 pg (ref 26.0–34.0)
MCHC: 33 g/dL (ref 30.0–36.0)
MCV: 90.7 fL (ref 80.0–100.0)
Platelets: 226 10*3/uL (ref 150–400)
RBC: 3.77 MIL/uL — ABNORMAL LOW (ref 4.22–5.81)
RDW: 13.3 % (ref 11.5–15.5)
WBC: 24.7 10*3/uL — ABNORMAL HIGH (ref 4.0–10.5)
nRBC: 0 % (ref 0.0–0.2)

## 2021-05-18 LAB — COMPREHENSIVE METABOLIC PANEL
ALT: 36 U/L (ref 0–44)
AST: 20 U/L (ref 15–41)
Albumin: 2.9 g/dL — ABNORMAL LOW (ref 3.5–5.0)
Alkaline Phosphatase: 99 U/L (ref 38–126)
Anion gap: 7 (ref 5–15)
BUN: 18 mg/dL (ref 8–23)
CO2: 26 mmol/L (ref 22–32)
Calcium: 8.3 mg/dL — ABNORMAL LOW (ref 8.9–10.3)
Chloride: 100 mmol/L (ref 98–111)
Creatinine, Ser: 1.79 mg/dL — ABNORMAL HIGH (ref 0.61–1.24)
GFR, Estimated: 40 mL/min — ABNORMAL LOW (ref >60)
Glucose, Bld: 116 mg/dL — ABNORMAL HIGH (ref 70–99)
Potassium: 4.2 mmol/L (ref 3.5–5.1)
Sodium: 133 mmol/L — ABNORMAL LOW (ref 135–145)
Total Bilirubin: 0.9 mg/dL (ref 0.3–1.2)
Total Protein: 6.4 g/dL — ABNORMAL LOW (ref 6.5–8.1)

## 2021-05-18 LAB — MAGNESIUM: Magnesium: 2 mg/dL (ref 1.7–2.4)

## 2021-05-18 SURGERY — LAPAROSCOPIC CHOLECYSTECTOMY WITH INTRAOPERATIVE CHOLANGIOGRAM
Anesthesia: General | Site: Abdomen

## 2021-05-18 MED ORDER — LIDOCAINE 2% (20 MG/ML) 5 ML SYRINGE
INTRAMUSCULAR | Status: DC | PRN
  Administered 2021-05-18: 100 mg via INTRAVENOUS

## 2021-05-18 MED ORDER — ROCURONIUM BROMIDE 10 MG/ML (PF) SYRINGE
PREFILLED_SYRINGE | INTRAVENOUS | Status: DC | PRN
  Administered 2021-05-18: 30 mg via INTRAVENOUS
  Administered 2021-05-18: 70 mg via INTRAVENOUS

## 2021-05-18 MED ORDER — FENTANYL CITRATE (PF) 250 MCG/5ML IJ SOLN
INTRAMUSCULAR | Status: AC
  Filled 2021-05-18: qty 5

## 2021-05-18 MED ORDER — ORAL CARE MOUTH RINSE: 15.0000 mL | Freq: Once | OROMUCOSAL | Status: AC

## 2021-05-18 MED ORDER — FENTANYL CITRATE (PF) 250 MCG/5ML IJ SOLN
INTRAMUSCULAR | Status: DC | PRN
  Administered 2021-05-18: 100 ug via INTRAVENOUS
  Administered 2021-05-18: 50 ug via INTRAVENOUS

## 2021-05-18 MED ORDER — LORAZEPAM 2 MG/ML IJ SOLN
1.0000 mg | INTRAMUSCULAR | Status: DC | PRN
Start: 2021-05-18 — End: 2021-05-19

## 2021-05-18 MED ORDER — LACTATED RINGERS IV SOLN: INTRAVENOUS | Status: DC

## 2021-05-18 MED ORDER — HEMOSTATIC AGENTS (NO CHARGE) OPTIME
TOPICAL | Status: DC | PRN
  Administered 2021-05-18 (×2): 1 via TOPICAL

## 2021-05-18 MED ORDER — ONDANSETRON HCL 4 MG/2ML IJ SOLN: 4.0000 mg | Freq: Once | INTRAMUSCULAR | Status: DC | PRN

## 2021-05-18 MED ORDER — PHENYLEPHRINE 80 MCG/ML (10ML) SYRINGE FOR IV PUSH (FOR BLOOD PRESSURE SUPPORT)
PREFILLED_SYRINGE | INTRAVENOUS | Status: DC | PRN
  Administered 2021-05-18: 80 ug via INTRAVENOUS
  Administered 2021-05-18: 160 ug via INTRAVENOUS
  Administered 2021-05-18: 80 ug via INTRAVENOUS
  Administered 2021-05-18: 160 ug via INTRAVENOUS
  Administered 2021-05-18: 80 ug via INTRAVENOUS

## 2021-05-18 MED ORDER — LACTATED RINGERS IV SOLN
INTRAVENOUS | Status: DC
Start: 2021-05-18 — End: 2021-05-19

## 2021-05-18 MED ORDER — HYDROMORPHONE HCL 1 MG/ML IJ SOLN
1.0000 mg | INTRAMUSCULAR | Status: DC | PRN
  Administered 2021-05-19: 1 mg via INTRAVENOUS
  Filled 2021-05-18 (×2): qty 1

## 2021-05-18 MED ORDER — BUPIVACAINE LIPOSOME 1.3 % IJ SUSP
INTRAMUSCULAR | Status: AC
  Filled 2021-05-18: qty 20

## 2021-05-18 MED ORDER — FENTANYL CITRATE PF 50 MCG/ML IJ SOSY
25.0000 ug | PREFILLED_SYRINGE | INTRAMUSCULAR | Status: DC | PRN
  Administered 2021-05-18 (×3): 50 ug via INTRAVENOUS
  Filled 2021-05-18 (×3): qty 1

## 2021-05-18 MED ORDER — 0.9 % SODIUM CHLORIDE (POUR BTL) OPTIME
TOPICAL | Status: DC | PRN
  Administered 2021-05-18: 1000 mL

## 2021-05-18 MED ORDER — DEXAMETHASONE SODIUM PHOSPHATE 10 MG/ML IJ SOLN
INTRAMUSCULAR | Status: AC
  Filled 2021-05-18: qty 1

## 2021-05-18 MED ORDER — BUPIVACAINE LIPOSOME 1.3 % IJ SUSP
INTRAMUSCULAR | Status: DC | PRN
  Administered 2021-05-18: 20 mL

## 2021-05-18 MED ORDER — ONDANSETRON HCL 4 MG/2ML IJ SOLN
INTRAMUSCULAR | Status: DC | PRN
  Administered 2021-05-18: 4 mg via INTRAVENOUS

## 2021-05-18 MED ORDER — CHLORHEXIDINE GLUCONATE 0.12 % MT SOLN
15.0000 mL | Freq: Once | OROMUCOSAL | Status: AC
  Administered 2021-05-18: 15 mL via OROMUCOSAL

## 2021-05-18 MED ORDER — SIMETHICONE 80 MG PO CHEW
40.0000 mg | CHEWABLE_TABLET | Freq: Four times a day (QID) | ORAL | Status: DC | PRN
  Administered 2021-05-19: 40 mg via ORAL
  Filled 2021-05-18: qty 1

## 2021-05-18 MED ORDER — HYDROCODONE-ACETAMINOPHEN 5-325 MG PO TABS: 1.0000 | ORAL_TABLET | ORAL | Status: DC | PRN

## 2021-05-18 MED ORDER — PROPOFOL 10 MG/ML IV BOLUS
INTRAVENOUS | Status: DC | PRN
Start: 2021-05-18 — End: 2021-05-18
  Administered 2021-05-18: 180 mg via INTRAVENOUS

## 2021-05-18 MED ORDER — DEXAMETHASONE SODIUM PHOSPHATE 10 MG/ML IJ SOLN
INTRAMUSCULAR | Status: DC | PRN
  Administered 2021-05-18: 10 mg via INTRAVENOUS

## 2021-05-18 MED ORDER — SUGAMMADEX SODIUM 200 MG/2ML IV SOLN
INTRAVENOUS | Status: DC | PRN
  Administered 2021-05-18 (×2): 200 mg via INTRAVENOUS

## 2021-05-18 SURGICAL SUPPLY — 47 items
APL SRG 38 LTWT LNG FL B (MISCELLANEOUS) ×1
APPLICATOR ARISTA FLEXITIP XL (MISCELLANEOUS) ×1 IMPLANT
APPLIER CLIP ROT 10 11.4 M/L (STAPLE) ×2
APR CLP MED LRG 11.4X10 (STAPLE) ×1
BAG RETRIEVAL 10 (BASKET) ×1
CHLORAPREP W/TINT 26 (MISCELLANEOUS) ×2 IMPLANT
CLIP APPLIE ROT 10 11.4 M/L (STAPLE) ×1 IMPLANT
CLOTH BEACON ORANGE TIMEOUT ST (SAFETY) ×2 IMPLANT
COVER LIGHT HANDLE STERIS (MISCELLANEOUS) ×4 IMPLANT
DERMABOND ADVANCED (GAUZE/BANDAGES/DRESSINGS) ×1
DERMABOND ADVANCED .7 DNX12 (GAUZE/BANDAGES/DRESSINGS) ×1 IMPLANT
ELECT REM PT RETURN 9FT ADLT (ELECTROSURGICAL) ×2
ELECTRODE REM PT RTRN 9FT ADLT (ELECTROSURGICAL) ×1 IMPLANT
GLOVE BIOGEL PI IND STRL 7.0 (GLOVE) ×2 IMPLANT
GLOVE BIOGEL PI IND STRL 7.5 (GLOVE) IMPLANT
GLOVE BIOGEL PI INDICATOR 7.0 (GLOVE) ×2
GLOVE BIOGEL PI INDICATOR 7.5 (GLOVE) ×1
GLOVE SURG SS PI 6.5 STRL IVOR (GLOVE) ×1 IMPLANT
GLOVE SURG SS PI 7.0 STRL IVOR (GLOVE) ×4 IMPLANT
GLOVE SURG SS PI 7.5 STRL IVOR (GLOVE) ×2 IMPLANT
GOWN STRL REUS W/TWL LRG LVL3 (GOWN DISPOSABLE) ×7 IMPLANT
HEMOSTAT ARISTA ABSORB 3G PWDR (HEMOSTASIS) ×1 IMPLANT
HEMOSTAT SNOW SURGICEL 2X4 (HEMOSTASIS) ×2 IMPLANT
INST SET LAPROSCOPIC AP (KITS) ×2 IMPLANT
IV NS IRRIG 3000ML ARTHROMATIC (IV SOLUTION) ×1 IMPLANT
KIT TURNOVER KIT A (KITS) ×2 IMPLANT
MANIFOLD NEPTUNE II (INSTRUMENTS) ×2 IMPLANT
NDL HYPO 21X1.5 SAFETY (NEEDLE) ×1 IMPLANT
NEEDLE HYPO 21X1.5 SAFETY (NEEDLE) ×2 IMPLANT
NEEDLE INSUFFLATION 120MM (ENDOMECHANICALS) ×2 IMPLANT
NS IRRIG 1000ML POUR BTL (IV SOLUTION) ×2 IMPLANT
PACK LAP CHOLE LZT030E (CUSTOM PROCEDURE TRAY) ×2 IMPLANT
PAD ARMBOARD 7.5X6 YLW CONV (MISCELLANEOUS) ×2 IMPLANT
SET BASIN LINEN APH (SET/KITS/TRAYS/PACK) ×2 IMPLANT
SET TUBE IRRIG SUCTION NO TIP (IRRIGATION / IRRIGATOR) ×1 IMPLANT
SET TUBE SMOKE EVAC HIGH FLOW (TUBING) ×2 IMPLANT
SLEEVE ENDOPATH XCEL 5M (ENDOMECHANICALS) ×2 IMPLANT
SUT MNCRL AB 4-0 PS2 18 (SUTURE) ×4 IMPLANT
SUT VICRYL 0 UR6 27IN ABS (SUTURE) ×3 IMPLANT
SYR 20ML LL LF (SYRINGE) ×3 IMPLANT
SYS BAG RETRIEVAL 10MM (BASKET) ×1
SYSTEM BAG RETRIEVAL 10MM (BASKET) ×1 IMPLANT
TROCAR ENDO BLADELESS 11MM (ENDOMECHANICALS) ×2 IMPLANT
TROCAR XCEL NON-BLD 5MMX100MML (ENDOMECHANICALS) ×2 IMPLANT
TROCAR XCEL UNIV SLVE 11M 100M (ENDOMECHANICALS) ×2 IMPLANT
TUBE CONNECTING 12X1/4 (SUCTIONS) ×2 IMPLANT
WARMER LAPAROSCOPE (MISCELLANEOUS) ×2 IMPLANT

## 2021-05-18 NOTE — Progress Notes (Signed)
?  Transition of Care (TOC) Screening Note ? ? ?Patient Details  ?Name: Johnny Johns ?Date of Birth: [birth year] ? ? ?Transition of Care (TOC) CM/SW Contact:    ?Villa Herb, LCSWA ?Phone Number: ?05/18/2021, 10:56 AM ? ? ? ?Transition of Care Department [facility]) has reviewed patient and no TOC needs have been identified at this time. We will continue to monitor patient advancement through interdisciplinary progression rounds. If new patient transition needs arise, please place a TOC consult. ?  ?

## 2021-05-18 NOTE — Anesthesia Preprocedure Evaluation (Addendum)
Anesthesia Evaluation  ?Patient identified by MRN, date of birth, ID band ?Patient awake ? ? ? ?Reviewed: ?Allergy & Precautions, H&P , NPO status , Patient's Chart, lab work & pertinent test results, reviewed documented beta blocker date and time  ? ?Airway ?Mallampati: II ? ?TM Distance: >3 FB ?Neck ROM: full ? ? ? Dental ?no notable dental hx. ? ?  ?Pulmonary ?neg pulmonary ROS, former smoker,  ?  ?Pulmonary exam normal ?breath sounds clear to auscultation ? ? ? ? ? ? Cardiovascular ?Exercise Tolerance: Good ?hypertension, + Past MI  ? ?Rhythm:regular Rate:Normal ? ? ?  ?Neuro/Psych ?negative neurological ROS ? negative psych ROS  ? GI/Hepatic ?Neg liver ROS, PUD,   ?Endo/Other  ?negative endocrine ROS ? Renal/GU ?CRFRenal disease  ?negative genitourinary ?  ?Musculoskeletal ? ? Abdominal ?  ?Peds ? Hematology ?negative hematology ROS ?(+)   ?Anesthesia Other Findings ? ? Reproductive/Obstetrics ?negative OB ROS ? ?  ? ? ? ? ? ? ? ? ? ? ? ? ? ?  ?  ? ? ? ? ? ? ? ? ?Anesthesia Physical ?Anesthesia Plan ? ?ASA: 3 ? ?Anesthesia Plan: General and General ETT  ? ?Post-op Pain Management:   ? ?Induction:  ? ?PONV Risk Score and Plan: Ondansetron ? ?Airway Management Planned:  ? ?Additional Equipment:  ? ?Intra-op Plan:  ? ?Post-operative Plan:  ? ?Informed Consent: I have reviewed the patients History and Physical, chart, labs and discussed the procedure including the risks, benefits and alternatives for the proposed anesthesia with the patient or authorized representative who has indicated his/her understanding and acceptance.  ? ? ? ?Dental Advisory Given ? ?Plan Discussed with: CRNA ? ?Anesthesia Plan Comments:   ? ? ? ? ? ?Anesthesia Quick Evaluation ? ?

## 2021-05-18 NOTE — Progress Notes (Signed)
?  ?       ?PROGRESS NOTE ? ?Johnny Johns [id number] DOB: 06/23/1950 DOA: 05/16/2021 ?PCP: Asencion Noble, MD ? ?Brief History:  ?71 year old male with a history of coronary artery disease, chronic neck pain status post anterior cervical discectomy 05/01/2021, hyperlipidemia, hypertension, peptic ulcer disease, IBS presenting with epigastric pain.  The patient was recently admitted to the hospital from 05/14/2021 through the 05/16/2021 with chest pain and upper abdominal pain.  His EKGs have been reassuring.  Echocardiogram on 05/15/2021 showed normal EF with no WMA.  He was seen by cardiology.  It was felt that his chest discomfort was noncardiac.  The patient had a CTA of the chest, abdomen, and pelvis which did not show any aortic aneurysm or dissection.  It did show some cholelithiasis.  There was centrilobular emphysema.  There was a retained 6 mm distal right ureteral stone without any hydronephrosis.  Right upper quadrant ultrasound on 05/15/2021 showed a distended gallbladder with mild wall thickening and some pericholecystic fluid.  His LFTs were normal.  The patient was seen by general surgery.  They recommended a HIDA scan.  The patient did not want to stay in the hospital for the weekend, and he was discharged home in stable condition.  When he returned home, the patient had an increase in his upper abdominal pain, particularly after eating some food.  He denied any fevers, chills, shortness breath, nausea, vomiting, diarrhea.  He took some Pepto-Bismol and acetaminophen without any improvement.  Because of his increased abdominal pain, the patient return for further evaluation and treatment. ?In the ED, the patient had a low-grade temperature of 100.0 ?F.  He was tachycardic up into the 110s.  He was hemodynamically stable with oxygen saturation 95% room air.  BMP showed sodium 135, potassium 4.2, serum creatinine 1.5.  AST 59, ALT 64, alkaline phosphatase 103, total bilirubin 1.1.  Lipase was 23.  EKG shows  sinus rhythm without concerning ST-T wave changes.  Chest x-ray showed chronic interstitial markings.  The patient was started on Zosyn, IV fluids, and IV opioids.  General surgery was reconsulted.  ? ?Assessment and Plan: ?* Sepsis due to undetermined organism [facility]) ?Present on admission ?Presented with leukocytosis and tachycardia ?Due to acalculous cholecystitis ?Continue IVF ?Continue zosyn ? ?Gangrenous cholecystitis ?Patient's clinical progression now more suggestive of a calculus cholecystitis with increased WBC and increased LFTs and positive Murphy sign ?Continue IV fluids ?Continue Zosyn ?Judicious IV opioids ?05/18/21--lap chole--discussed with Dr. Arnoldo Morale ? ?Atypical chest pain ?Troponins 7>>8 ?05/15/2021 echo EF 60 to 65%, no WMA ?Personally reviewed EKG--sinus rhythm, no concerning ST-T wave change ?Personally reviewed chest x-ray chronic interstitial markings with mild increase; RLL atelectasis ? ?Right ureteral calculus ?Patient is followed by Dr. Michaelle Birks ?Right ureteral stent initially placed 02/06/2021 ?Right ESL 02/10/2021 ?03/02/2021 right ureteral stent removal ?Foley removed 02/12/21 ?05/15/2021 CT abdomen and pelvis--6 mm distal right ureteral calculus without hydronephrosis ?will need outpatient follow-up with urology--appreciate Dr. Alyson Ingles ? ?AKI (acute kidney injury) ([place]) ?Baseline creatinine 1.1-1.3 ?Serum creatinine peaked 1.79 ?Due sepsis and hemodynamic changes ?IVF ?Am BMP ? ?Essential hypertension ?Holding ARB secondary to soft blood pressure ? ?BPH (benign prostatic hyperplasia) ?Continue tamsulosin ? ?Mixed hyperlipidemia ?Holding statin temporarily secondary to elevated LFTs ? ? ?  ?Family Communication:  spouse updated at bedside 5/8 ?  ?Consultants:  general surgery ?  ?Code Status:  FULL  ?  ?DVT Prophylaxis:  [place] Lovenox ?  ?  ?Procedures: ?As Listed in Progress Note Above ?  ?  Antibiotics: ?Zosyn 5/6>> ?  ? ? ? ? ? ?Subjective: ?Patient complains of abd pain and bloating post  op.  No BM yet.  Denies f/c, cp, sob, n/v. ? ?Objective: ?Vitals:  ? 05/18/21 1000 05/18/21 1015 05/18/21 1033 05/18/21 1100  ?BP: 120/81 115/70 108/72 110/64  ?Pulse: (!) 107 (!) 109 (!) 102 (!) 103  ?Resp: 18 20 16 17   ?Temp:      ?TempSrc:      ?SpO2: 100% 90% 95% 90%  ?Weight:      ?Height:      ? ? ?Intake/Output Summary (Last 24 hours) at 05/18/2021 1436 ?Last data filed at 05/18/2021 0950 ?Gross per 24 hour  ?Intake 1882.1 ml  ?Output 10 ml  ?Net 1872.1 ml  ? ?Weight change:  ?Exam: ? ?General:  Pt is alert, follows commands appropriately, not in acute distress ?HEENT: No icterus, No thrush, No neck mass, [place]/AT ?Cardiovascular: RRR, S1/S2, no rubs, no gallops ?Respiratory: bibasilar crackles. No wheeze ?Abdomen: Soft/+BS, diffuse tender, mildly distended, no guarding ?Extremities: No edema, No lymphangitis, No petechiae, No rashes, no synovitis ? ? ?Data Reviewed: ?I have personally reviewed following labs and imaging studies ?Basic Metabolic Panel: ?Recent Labs  ?Lab 05/14/21 ?2343 05/16/21 ?[id number] 05/16/21 ?1815 05/17/21 ?0457 05/18/21 ?0505  ?NA 138 136 135 134* 133*  ?K 4.0 4.0 4.2 4.1 4.2  ?CL 105 104 101 103 100  ?CO2 26 26 25 24 26   ?GLUCOSE 110* 120* 144* 113* 116*  ?BUN 28* 19 18 14 18   ?CREATININE 1.35* 1.14 1.15 1.16 1.79*  ?CALCIUM 9.4 8.9 8.9 8.4* 8.3*  ?MG  --   --   --  1.8 2.0  ?PHOS  --   --   --  2.8  --   ? ?Liver Function Tests: ?Recent Labs  ?Lab 05/14/21 ?2343 05/16/21 ?[id number] 05/16/21 ?1815 05/17/21 ?0457 05/18/21 ?0505  ?AST 18 23 59* 34 20  ?ALT 22 27 64* 55* 36  ?ALKPHOS 89 79 103 88 99  ?BILITOT 0.5 0.9 1.1 1.0 0.9  ?PROT 7.5 7.1 7.5 6.8 6.4*  ?ALBUMIN 4.0 3.8 3.9 3.3* 2.9*  ? ?Recent Labs  ?Lab 05/14/21 ?2343 05/16/21 ?1815  ?LIPASE 92* 23  ? ?No results for input(s): AMMONIA in the last 168 hours. ?Coagulation Profile: ?No results for input(s): INR, PROTIME in the last 168 hours. ?CBC: ?Recent Labs  ?Lab 05/14/21 ?2343 05/16/21 ?0815 05/16/21 ?1958 05/17/21 ?0457 05/18/21 ?0505  ?WBC  10.7* 14.6* 24.0* 26.6* 24.7*  ?HGB 13.3 13.0 12.4* 12.2* 11.3*  ?HCT 41.3 39.5 38.6* 37.0* 34.2*  ?MCV 91.4 89.8 89.8 89.4 90.7  ?PLT 270 219 225 249 226  ? ?Cardiac Enzymes: ?No results for input(s): CKTOTAL, CKMB, CKMBINDEX, TROPONINI in the last 168 hours. ?BNP: ?Invalid input(s): POCBNP ?CBG: ?No results for input(s): GLUCAP in the last 168 hours. ?HbA1C: ?No results for input(s): HGBA1C in the last 72 hours. ?Urine analysis: ?   ?Component Value Date/Time  ? COLORURINE YELLOW 02/07/2021 0315  ? APPEARANCEUR Clear 04/13/2021 1003  ? LABSPEC 1.025 02/07/2021 0315  ? PHURINE 6.5 02/07/2021 0315  ? GLUCOSEU Negative 04/13/2021 1003  ? HGBUR LARGE (A) 02/07/2021 0315  ? BILIRUBINUR Negative 04/13/2021 1003  ? [place] NEGATIVE 02/07/2021 0315  ? PROTEINUR Trace (A) 04/13/2021 1003  ? PROTEINUR >300 (A) 02/07/2021 0315  ? NITRITE Negative 04/13/2021 1003  ? NITRITE POSITIVE (A) 02/07/2021 0315  ? LEUKOCYTESUR Trace (A) 04/13/2021 1003  ? LEUKOCYTESUR SMALL (A) 02/07/2021 0315  ? ?Sepsis Labs: ?@LABRCNTIP (procalcitonin:4,lacticidven:4) ?) ?  Recent Results (from the past 240 hour(s))  ?MRSA Next Gen by PCR, Nasal     Status: None  ? Collection Time: 05/15/21  7:17 AM  ? Specimen: Nasal Mucosa; Nasal Swab  ?Result Value Ref Range Status  ? MRSA by PCR Next Gen NOT DETECTED NOT DETECTED Final  ?  Comment: (NOTE) ?The GeneXpert MRSA Assay (FDA approved for NASAL specimens only), ?is one component of a comprehensive MRSA colonization surveillance ?program. It is not intended to diagnose MRSA infection nor to guide ?or monitor treatment for MRSA infections. ?Test performance is not FDA approved in patients less than 2 years ?old. ?Performed at [facility], [address], [place], [place] [address] ?  ?  ? ?Scheduled Meds: ? enoxaparin (LOVENOX) injection  40 mg Subcutaneous Q24H  ? pantoprazole (PROTONIX) IV  40 mg Intravenous Q12H  ? tamsulosin  0.4 mg Oral Daily  ? ?Continuous Infusions: ? lactated ringers 75 mL/hr at  05/18/21 0834  ? piperacillin-tazobactam (ZOSYN)  IV 3.375 g (05/18/21 0511)  ? ? ?Procedures/Studies: ?DG Chest 2 View ? ?Result Date: 05/15/2021 ?CLINICAL DATA:  Chest pain for 2 hours with nausea, initial enc

## 2021-05-18 NOTE — Transfer of Care (Signed)
Immediate Anesthesia Transfer of Care Note ? ?Patient: Johnny Johns ? ?Procedure(s) Performed: LAPAROSCOPIC CHOLECYSTECTOMY (Abdomen) ? ?Patient Location: PACU ? ?Anesthesia Type:General ? ?Level of Consciousness: awake, alert , oriented and patient cooperative ? ?Airway & Oxygen Therapy: Patient Spontanous Breathing and Patient connected to nasal cannula oxygen ? ?Post-op Assessment: Report given to RN, Post -op Vital signs reviewed and stable and Patient moving all extremities ? ?Post vital signs: Reviewed and stable ? ?Last Vitals:  ?Vitals Value Taken Time  ?BP 119/76 05/18/21 0945  ?Temp    ?Pulse 108 05/18/21 0950  ?Resp 21 05/18/21 0950  ?SpO2 100 % 05/18/21 0950  ?Vitals shown include unvalidated device data. ? ?Last Pain:  ?Vitals:  ? 05/18/21 0745  ?TempSrc: Oral  ?PainSc: 2   ?   ? ?Patients Stated Pain Goal: 0 (05/18/21 0317) ? ?Complications: No notable events documented. ?

## 2021-05-18 NOTE — Assessment & Plan Note (Addendum)
Patient's clinical progression now more suggestive of acalculus cholecystitis with increased WBC and increased LFTs and positive Murphy sign ?Continued IV fluids ?Continued Zosyn post op>>d/c home with amox/clav x 7 moredays ?Judicious IV opioids ?05/18/21--lap chole--discussed with Dr. Lovell Sheehan ?Diet advanced postop which pt tolerated ?5/9--discussed with Dr. Alesia Richards to d/c home ?

## 2021-05-18 NOTE — Discharge Summary (Signed)
?Physician Discharge Summary ?  ?Patient: Johnny Johns MRN: [id number] DOB: 1950-11-04  ?Admit date:     05/16/2021  ?Discharge date: 05/19/2021  ?Discharge Physician: Shanon Brow Envy Meno  ? ?PCP: Asencion Noble, MD  ? ?Recommendations at discharge:  ? Please follow up with primary care provider within 1-2 weeks ? Please repeat BMP and CBC in one week ? ? ? ?Hospital Course: ?71 year old male with a history of coronary artery disease, chronic neck pain status post anterior cervical discectomy 05/01/2021, hyperlipidemia, hypertension, peptic ulcer disease, IBS presenting with epigastric pain.  The patient was recently admitted to the hospital from 05/14/2021 through the 05/16/2021 with chest pain and upper abdominal pain.  His EKGs have been reassuring.  Echocardiogram on 05/15/2021 showed normal EF with no WMA.  He was seen by cardiology.  It was felt that his chest discomfort was noncardiac.  The patient had a CTA of the chest, abdomen, and pelvis which did not show any aortic aneurysm or dissection.  It did show some cholelithiasis.  There was centrilobular emphysema.  There was a retained 6 mm distal right ureteral stone without any hydronephrosis.  Right upper quadrant ultrasound on 05/15/2021 showed a distended gallbladder with mild wall thickening and some pericholecystic fluid.  His LFTs were normal.  The patient was seen by general surgery.  They recommended a HIDA scan.  The patient did not want to stay in the hospital for the weekend, and he was discharged home in stable condition.  When he returned home, the patient had an increase in his upper abdominal pain, particularly after eating some food.  He denied any fevers, chills, shortness breath, nausea, vomiting, diarrhea.  He took some Pepto-Bismol and acetaminophen without any improvement.  Because of his increased abdominal pain, the patient return for further evaluation and treatment. ?In the ED, the patient had a low-grade temperature of 100.0 ?F.  He was tachycardic up into  the 110s.  He was hemodynamically stable with oxygen saturation 95% room air.  BMP showed sodium 135, potassium 4.2, serum creatinine 1.5.  AST 59, ALT 64, alkaline phosphatase 103, total bilirubin 1.1.  Lipase was 23.  EKG shows sinus rhythm without concerning ST-T wave changes.  Chest x-ray showed chronic interstitial markings.  The patient was started on Zosyn, IV fluids, and IV opioids.  General surgery was reconsulted. ? ?Assessment and Plan: ?* Sepsis due to undetermined organism [facility]) ?Present on admission ?Presented with leukocytosis and tachycardia ?Due to acalculous cholecystitis ?Continued IVF ?Continued zosyn>>d/c home with amox/clav x 7 days ?Sepsis physiology resolved at time of dc ? ?Gangrenous cholecystitis ?Patient's clinical progression now more suggestive of acalculus cholecystitis with increased WBC and increased LFTs and positive Murphy sign ?Continued IV fluids ?Continued Zosyn post op>>d/c home with amox/clav x 7 moredays ?Judicious IV opioids ?05/18/21--lap chole--discussed with Dr. Arnoldo Morale ?Diet advanced postop which pt tolerated ?5/9--discussed with Dr. Iona Coach to d/c home ? ?Atypical chest pain ?Troponins 7>>8 ?05/15/2021 echo EF 60 to 65%, no WMA ?Personally reviewed EKG--sinus rhythm, no concerning ST-T wave change ?Personally reviewed chest x-ray chronic interstitial markings with mild increase; RLL atelectasis ? ?Right ureteral calculus ?Patient is followed by Dr. Michaelle Birks ?Right ureteral stent initially placed 02/06/2021 ?Right ESL 02/10/2021 ?03/02/2021 right ureteral stent removal ?Foley removed 02/12/21 ?05/15/2021 CT abdomen and pelvis--6 mm distal right ureteral calculus without hydronephrosis ?will need outpatient follow-up with urology--appreciate Dr. Alyson Ingles consult>>no intervention needed at this time ? ?AKI (acute kidney injury) ([place]) ?Baseline creatinine 1.1-1.3 ?Serum creatinine peaked 1.79 ?Due  sepsis and hemodynamic changes ?IVF during the hospitalization ?Serum  creatinine 1.57 on day of d/c ? ? ?Essential hypertension ?Holding ARB secondary to soft blood pressure initially ? ?BPH (benign prostatic hyperplasia) ?Continue tamsulosin ? ?Mixed hyperlipidemia ?Holding statin temporarily secondary to elevated LFTs>>restart after d/c ? ? ? ? ?  ? ? ?Consultants: general surgery ?Procedures performed: lap chole 5/8  ?Disposition: Home ?Diet recommendation:  ?Cardiac diet ?DISCHARGE MEDICATION: ?Allergies as of 05/19/2021   ? ?   Reactions  ? Gemfibrozil   ? Metoprolol Other (See Comments)  ? Myalgias and neck pain  ? Simvastatin Other (See Comments)  ? Myalgias; also Vytorin, pravastatin and Lipitor  ? Statins Other (See Comments)  ? Muscle aches  ? Vioxx [rofecoxib]   ? ?  ? ?  ?Medication List  ?  ? ?STOP taking these medications   ? ?celecoxib 200 MG capsule ?Commonly known as: CELEBREX ?  ? ?  ? ?TAKE these medications   ? ?acetaminophen 650 MG CR tablet ?Commonly known as: TYLENOL ?Take 1,300 mg by mouth every 8 (eight) hours as needed for pain. ?  ?amoxicillin-clavulanate 875-125 MG tablet ?Commonly known as: Augmentin ?Take 1 tablet by mouth 2 (two) times daily. ?  ?HYDROcodone-acetaminophen 5-325 MG tablet ?Commonly known as: Norco ?Take 1 tablet by mouth every 6 (six) hours as needed for moderate pain. ?  ?methocarbamol 750 MG tablet ?Commonly known as: ROBAXIN ?Take 750 mg by mouth 3 (three) times daily as needed. ?  ?pantoprazole 40 MG tablet ?Commonly known as: Protonix ?Take 1 tablet (40 mg total) by mouth 2 (two) times daily before a meal. ?  ?pravastatin 10 MG tablet ?Commonly known as: PRAVACHOL ?Take 10 mg by mouth once a week. ?  ?tamsulosin 0.4 MG Caps capsule ?Commonly known as: FLOMAX ?Take 1 capsule (0.4 mg total) by mouth daily. ?  ?valsartan 320 MG tablet ?Commonly known as: DIOVAN ?Take 320 mg by mouth at bedtime. ?  ? ?  ? ? Follow-up Information   ? ? Aviva Signs, MD Follow up.   ?Specialty: General Surgery ?Why: As needed.  Will call you next week for  follow up. ?Contact information: ?[address] ?[place] [address] ?[phone] ? ? ?  ?  ? ?  ?  ? ?  ? ?Discharge Exam: ?Filed Weights  ? 05/16/21 1801 05/16/21 2243  ?Weight: 106.6 kg 109.8 kg  ? ?HEENT:  [place]/AT, No thrush, no icterus ?CV:  RRR, no rub, no S3, no S4 ?Lung:  CTA, no wheeze, no rhonchi ?Abd:  soft/+BS, mild diffuse tenderness ?Ext:  No edema, no lymphangitis, no synovitis, no rash  ? ?Condition at discharge: stable ? ?The results of significant diagnostics from this hospitalization (including imaging, microbiology, ancillary and laboratory) are listed below for reference.  ? ?Imaging Studies: ?DG Chest 2 View ? ?Result Date: 05/15/2021 ?CLINICAL DATA:  Chest pain for 2 hours with nausea, initial encounter EXAM: CHEST - 2 VIEW COMPARISON:  04/09/2004 FINDINGS: The heart size and mediastinal contours are within normal limits. Both lungs are clear. The visualized skeletal structures are unremarkable. IMPRESSION: No active cardiopulmonary disease. Electronically Signed   By: Inez Catalina M.D.   On: 05/15/2021 00:12  ? ?US Abdomen Limited ? ?Result Date: 05/15/2021 ?CLINICAL DATA:  71 year old male with suspected cholelithiasis on CT. EXAM: ULTRASOUND ABDOMEN LIMITED RIGHT UPPER QUADRANT COMPARISON:  CT Chest, Abdomen, and Pelvis 0110 hours today. FINDINGS: Gallbladder: Gallbladder distension similar to the earlier CT. Mild wall thickening up to  of between 4 to 6 mm (image 17). No cholelithiasis or sludge identified. There is trace pericholecystic fluid (image 41). Common bile duct: Diameter: 4-5 mm, within normal limits. Liver: Increased liver echogenicity. Some areas of the liver are difficult to penetrate. No discrete liver lesion or intrahepatic biliary ductal dilatation is identified. Portal vein is patent on color Doppler imaging with normal direction of blood flow towards the liver. Other: Negative visible right kidney. IMPRESSION: 1. Distended gallbladder with mild wall thickening and  pericholecystic fluid. No gallstones or sludge identified. Difficult to exclude Acalculous Cholecystitis. 2. No evidence of bile duct obstruction. 3. Hepatic steatosis. Electronically Signed   By: Herminio Heads.D.

## 2021-05-18 NOTE — Interval H&P Note (Signed)
History and Physical Interval Note: ? ?05/18/2021 ?8:05 AM ? ?Johnny Johns  has presented today for surgery, with the diagnosis of cholecystitis.  The various methods of treatment have been discussed with the patient and family. After consideration of risks, benefits and other options for treatment, the patient has consented to  Procedure(s): ?LAPAROSCOPIC CHOLECYSTECTOMY WITH INTRAOPERATIVE CHOLANGIOGRAM (N/A) as a surgical intervention.  The patient's history has been reviewed, patient examined, no change in status, stable for surgery.  I have reviewed the patient's chart and labs.  Questions were answered to the patient's satisfaction.   ? ? ?[name] ? ? ?

## 2021-05-18 NOTE — Op Note (Signed)
Patient:  Johnny Johns ? ?DOB:  1950/06/13 ? ?MRN:  [id number] ? ? ?Preop Diagnosis: Acute cholecystitis, cholelithiasis ? ?Postop Diagnosis: Same, gangrene of gallbladder ? ?Procedure: Laparoscopic cholecystectomy ? ?Surgeon: Aviva Signs, MD ? ?Assistant: Curlene Labrum, MD ? ?Anes: General endotracheal ? ?Indications: Patient is a 71 year old white male who presents with acute cholecystitis secondary to cholelithiasis.  The risks and benefits of the procedure including bleeding, infection, hepatobiliary injury, and the possibility of an open procedure were fully explained to the patient, who gave informed consent. ? ?Procedure note: The patient was placed in the supine position.  After induction of general endotracheal anesthesia, the abdomen was prepped draped using the usual sterile technique with ChloraPrep.  Surgical site confirmation was performed. ? ?A supraumbilical incision was made down to the fascia.  A Veress needle was introduced into the abdominal cavity and confirmation of placement was done using the saline drop test.  The abdomen was then insufflated to 15 mmHg pressure.  An 11 mm trocar was introduced into the abdominal cavity under direct visualization without difficulty.  The patient was placed in reverse Trendelenburg position and an additional 11 mm trocar was placed in the epigastric region and 5 mm trocars were placed the right upper quadrant and right flank regions.  The liver was inspected and noted within normal limits.  The gallbladder was encased with omentum.  Gangrene of the gallbladder was noted with a thickened gallbladder wall and distention.  It was decompressed in order to facilitate exposure.  The gallbladder was retracted in a dynamic fashion in order to provide a critical view of the triangle of Calot.  The cystic duct was first identified.  Its junction to the infundibulum was fully identified.  Endoclips were placed proximally and distally on the cystic duct, and the  cystic duct was divided.  This was likewise done the cystic artery.  The gallbladder was freed away from the gallbladder fossa using Bovie electrocautery.  The gallbladder was delivered through the epigastric trocar site using an Endo Catch bag.  The gallbladder fossa was inspected and any bleeding was controlled using Bovie electrocautery.  Surgicel powder and snow were then placed in the gallbladder fossa.  All fluid and air were then evacuated from the abdominal cavity prior to removal of the trocars. ? ?All wounds were irrigated with normal saline.  All wounds were injected with Exparel.  The epigastric fascia as well as the supraumbilical fascia was reapproximated using 0 Vicryl interrupted sutures.  All skin incisions were closed using a 4-0 Monocryl subcuticular suture.  Dermabond was applied. ? ?All tape and needle counts were correct at the end of the procedure.  The patient was extubated in the operating room and transferred to PACU in stable condition. ? ?Dr. Constance Haw assisted as no registered nurse assistant was available.  She assisted in the dissection of the gallbladder, exposure of the triangle of Calot, and closure of the abdominal incisions. ? ?Complications: None ? ?EBL: Minimal ? ?Specimen: Gallbladder ? ? ?  ?

## 2021-05-18 NOTE — Assessment & Plan Note (Addendum)
Baseline creatinine 1.1-1.3 ?Serum creatinine peaked 1.79 ?Due sepsis and hemodynamic changes ?IVF during the hospitalization ?Serum creatinine 1.57 on day of d/c ? ?

## 2021-05-18 NOTE — Anesthesia Procedure Notes (Signed)
Procedure Name: Intubation ?Date/Time: 05/18/2021 8:42 AM ?Performed by: Myna Bright, CRNA ?Pre-anesthesia Checklist: Patient identified, Emergency Drugs available, Suction available and Patient being monitored ?Patient Re-evaluated:Patient Re-evaluated prior to induction ?Oxygen Delivery Method: Circle system utilized ?Preoxygenation: Pre-oxygenation with 100% oxygen ?Induction Type: IV induction ?Ventilation: Mask ventilation without difficulty ?Laryngoscope Size: Glidescope and 4 ?Tube type: Oral ?Tube size: 7.5 mm ?Number of attempts: 1 ?Airway Equipment and Method: Stylet and Video-laryngoscopy ?Placement Confirmation: ETT inserted through vocal cords under direct vision, positive ETCO2 and breath sounds checked- equal and bilateral ?Secured at: 22 cm ?Tube secured with: Tape ?Dental Injury: Teeth and Oropharynx as per pre-operative assessment  ?Difficulty Due To: Difficulty was anticipated, Difficult Airway- due to limited oral opening and Difficult Airway- due to reduced neck mobility ?Comments: Easy mask airway with OAW in place. Glidescope used d/t anticipated difficult airway. Good view with glide, atraumatic oral intubation.  ? ? ? ? ?

## 2021-05-18 NOTE — Anesthesia Postprocedure Evaluation (Signed)
Anesthesia Post Note ? ?Patient: Johnny Johns ? ?Procedure(s) Performed: LAPAROSCOPIC CHOLECYSTECTOMY (Abdomen) ? ?Patient location during evaluation: Phase II ?Anesthesia Type: General ?Level of consciousness: awake ?Pain management: pain level controlled ?Vital Signs Assessment: post-procedure vital signs reviewed and stable ?Respiratory status: spontaneous breathing and respiratory function stable ?Cardiovascular status: blood pressure returned to baseline and stable ?Postop Assessment: no headache and no apparent nausea or vomiting ?Anesthetic complications: no ?Comments: Late entry ? ? ?No notable events documented. ? ? ?Last Vitals:  ?Vitals:  ? 05/18/21 1033 05/18/21 1100  ?BP: 108/72 110/64  ?Pulse: (!) 102 (!) 103  ?Resp: 16 17  ?Temp:    ?SpO2: 95% 90%  ?  ?Last Pain:  ?Vitals:  ? 05/18/21 1025  ?TempSrc:   ?PainSc: 4   ? ? ?  ?  ?  ?  ?  ?  ? ?Windell Norfolk ? ? ? ? ?

## 2021-05-18 NOTE — Consult Note (Signed)
Urology Consult  ?Referring physician: Dr. Arbutus Leasat ?Reason for referral: right ureteral calculus ? ?Chief Complaint: abdominal pain ? ?History of Present Illness: [name] is a 71yo with a history of nephrolithiasis who presented to the ER 5/6 with acute cholecystitis. He underwent CT at the time of admission and was found to have a residual 5mm right distal ureteral calculus. He underwent right ureteral stent placement 02/06/2021 and then right ESWL 02/10/2021. He passed multiple fragments after ESWL. He right ureteral stent was removed 2/9. He has not had any right flank pain. He denies any significant LUTS. NO other complaints today.  ? ?Past Medical History:  ?Diagnosis Date  ? 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 Presented with acute myocardial infarction In 01/2003; isolated 90% proximal LAD lesion treated with DES; EF 45-50%  ? Degenerative joint disease   ? Chronic neck and back pain  ? Hyperlipidemia   ? lipid profile in 06/2010:195, 207, 37, 117  ? Hypertension   ? IBS (irritable bowel syndrome)   ? Myocardial infarction [facility](HCC)   ? PUD (peptic ulcer disease)   ? hx  ? Tobacco abuse, in remission   ? ?Past Surgical History:  ?Procedure Laterality Date  ? CARDIAC CATHETERIZATION    ? stent-2005  ? COLONOSCOPY  01/11/2006  ? CYSTOSCOPY W/ URETERAL STENT PLACEMENT Right 02/06/2021  ? Procedure: CYSTOSCOPY WITH RIGHT RETROGRADE PYELOGRAM/ RIGHT URETERAL STENT PLACEMENT/ RIGHT URETEROSCOPY;  Surgeon: Milderd MeagerStoneking, Bradley J., MD;  Location: [facility];  Service: Urology;  Laterality: Right;  ? EXTRACORPOREAL SHOCK WAVE LITHOTRIPSY Right 02/10/2021  ? Procedure: EXTRACORPOREAL SHOCK WAVE LITHOTRIPSY (ESWL);  Surgeon: Milderd MeagerStoneking, Bradley J., MD;  Location: [facility];  Service: Urology;  Laterality: Right;  ? KIDNEY STONE SURGERY  01/12/1988  ? ? ?Medications: I have reviewed the patient's current medications. ?Allergies:  ?Allergies  ?Allergen Reactions  ? Gemfibrozil   ? Metoprolol Other (See Comments)  ?   Myalgias and neck pain  ? Simvastatin Other (See Comments)  ?  Myalgias; also Vytorin, pravastatin and Lipitor  ? Statins Other (See Comments)  ?  Muscle aches  ? Vioxx [Rofecoxib]   ? ? ?History reviewed. No pertinent family history. ?Social History:  reports that he quit smoking about 18 years ago. His smoking use included cigarettes. He has a 30.00 pack-year smoking history. He has never used smokeless tobacco. He reports that he does not drink alcohol and does not use drugs. ? ?Review of Systems  ?All other systems reviewed and are negative. ? ?Physical Exam:  ?Vital signs in last 24 hours: ?Temp:  [98.5 ?F (36.9 ?C)-100.3 ?F (37.9 ?C)] 98.5 ?F (36.9 ?C) (05/08 1500) ?Pulse Rate:  [102-118] 105 (05/08 1500) ?Resp:  [16-22] 17 (05/08 1500) ?BP: (93-120)/(64-81) 93/74 (05/08 1500) ?SpO2:  [90 %-100 %] 94 % (05/08 1500) ?Physical Exam ?Vitals reviewed.  ?Constitutional:   ?   Appearance: He is well-developed.  ?HENT:  ?   Head: Normocephalic and atraumatic.  ?Cardiovascular:  ?   Rate and Rhythm: Normal rate and regular rhythm.  ?Pulmonary:  ?   Effort: Pulmonary effort is normal. No tachypnea.  ?Abdominal:  ?   General: There is no abdominal bruit.  ?   Palpations: Abdomen is soft.  ?Musculoskeletal:     ?   General: Normal range of motion.  ?   Cervical back: Normal range of motion and neck supple.  ?   Right lower leg: No edema.  ?Skin: ?   General: Skin is warm and dry.  ?  Neurological:  ?   General: No focal deficit present.  ?   Mental Status: He is alert and oriented to person, place, and time.  ?Psychiatric:     ?   Mood and Affect: Mood normal.     ?   Behavior: Behavior normal.  ? ? ?Laboratory Data:  ?Results for orders placed or performed during the hospital encounter of 05/16/21 (from the past 72 hour(s))  ?Basic metabolic panel     Status: Abnormal  ? Collection Time: 05/16/21  6:15 PM  ?Result Value Ref Range  ? Sodium 135 135 - 145 mmol/L  ? Potassium 4.2 3.5 - 5.1 mmol/L  ? Chloride 101 98 - 111  mmol/L  ? CO2 25 22 - 32 mmol/L  ? Glucose, Bld 144 (H) 70 - 99 mg/dL  ?  Comment: Glucose reference range applies only to samples taken after fasting for at least 8 hours.  ? BUN 18 8 - 23 mg/dL  ? Creatinine, Ser 1.15 0.61 - 1.24 mg/dL  ? Calcium 8.9 8.9 - 10.3 mg/dL  ? GFR, Estimated >60 >60 mL/min  ?  Comment: (NOTE) ?Calculated using the CKD-EPI Creatinine Equation (2021) ?  ? Anion gap 9 5 - 15  ?  Comment: Performed at [facility], [address], [place], [place] [address]  ?Troponin I (High Sensitivity)     Status: None  ? Collection Time: 05/16/21  6:15 PM  ?Result Value Ref Range  ? Troponin I (High Sensitivity) 7 <18 ng/L  ?  Comment: (NOTE) ?Elevated high sensitivity troponin I (hsTnI) values and significant  ?changes across serial measurements may suggest ACS but many other  ?chronic and acute conditions are known to elevate hsTnI results.  ?Refer to the "Links" section for chest pain algorithms and additional  ?guidance. ?Performed at [facility], [address] [place], [place] [address] ?  ?Hepatic function panel     Status: Abnormal  ? Collection Time: 05/16/21  6:15 PM  ?Result Value Ref Range  ? Total Protein 7.5 6.5 - 8.1 g/dL  ? Albumin 3.9 3.5 - 5.0 g/dL  ? AST 59 (H) 15 - 41 U/L  ? ALT 64 (H) 0 - 44 U/L  ? Alkaline Phosphatase 103 38 - 126 U/L  ? Total Bilirubin 1.1 0.3 - 1.2 mg/dL  ? Bilirubin, Direct 0.3 (H) 0.0 - 0.2 mg/dL  ? Indirect Bilirubin 0.8 0.3 - 0.9 mg/dL  ?  Comment: Performed at [facility] [address], [place], [place] [address]  ?Lipase, blood     Status: None  ? Collection Time: 05/16/21  6:15 PM  ?Result Value Ref Range  ? Lipase 23 11 - 51 U/L  ?  Comment: Performed at [facility], [address] [place], [place] [address]  ?CBC     Status: Abnormal  ? Collection Time: 05/16/21  7:58 PM  ?Result Value Ref Range  ? WBC 24.0 (H) 4.0 - 10.5 K/uL  ? RBC 4.30 4.22 - 5.81 MIL/uL  ? Hemoglobin 12.4 (L) 13.0 - 17.0 g/dL  ? HCT 38.6 (L) 39.0 - 52.0 %  ? MCV 89.8 80.0 - 100.0  fL  ? MCH 28.8 26.0 - 34.0 pg  ? MCHC 32.1 30.0 - 36.0 g/dL  ? RDW 13.2 11.5 - 15.5 %  ? Platelets 225 150 - 400 K/uL  ? nRBC 0.0 0.0 - 0.2 %  ?  Comment: Performed at [facility], [address] [place], [place] [address]  ?Troponin I (High  Sensitivity)     Status: None  ? Collection Time: 05/16/21  7:58 PM  ?Result Value Ref Range  ? Troponin I (High Sensitivity) 8 <18 ng/L  ?  Comment: (NOTE) ?Elevated high sensitivity troponin I (hsTnI) values and significant  ?changes across serial measurements may suggest ACS but many other  ?chronic and acute conditions are known to elevate hsTnI results.  ?Refer to the "Links" section for chest pain algorithms and additional  ?guidance. ?Performed at [facility], [address] [place], [place] [address] ?  ?Comprehensive metabolic panel     Status: Abnormal  ? Collection Time: 05/17/21  4:57 AM  ?Result Value Ref Range  ? Sodium 134 (L) 135 - 145 mmol/L  ? Potassium 4.1 3.5 - 5.1 mmol/L  ? Chloride 103 98 - 111 mmol/L  ? CO2 24 22 - 32 mmol/L  ? Glucose, Bld 113 (H) 70 - 99 mg/dL  ?  Comment: Glucose reference range applies only to samples taken after fasting for at least 8 hours.  ? BUN 14 8 - 23 mg/dL  ? Creatinine, Ser 1.16 0.61 - 1.24 mg/dL  ? Calcium 8.4 (L) 8.9 - 10.3 mg/dL  ? Total Protein 6.8 6.5 - 8.1 g/dL  ? Albumin 3.3 (L) 3.5 - 5.0 g/dL  ? AST 34 15 - 41 U/L  ? ALT 55 (H) 0 - 44 U/L  ? Alkaline Phosphatase 88 38 - 126 U/L  ? Total Bilirubin 1.0 0.3 - 1.2 mg/dL  ? GFR, Estimated >60 >60 mL/min  ?  Comment: (NOTE) ?Calculated using the CKD-EPI Creatinine Equation (2021) ?  ? Anion gap 7 5 - 15  ?  Comment: Performed at [facility], [address] [place], [place] [address]  ?CBC     Status: Abnormal  ? Collection Time: 05/17/21  4:57 AM  ?Result Value Ref Range  ? WBC 26.6 (H) 4.0 - 10.5 K/uL  ? RBC 4.14 (L) 4.22 - 5.81 MIL/uL  ? Hemoglobin 12.2 (L) 13.0 - 17.0 g/dL  ? HCT 37.0 (L) 39.0 - 52.0 %  ? MCV 89.4 80.0 - 100.0 fL  ? MCH 29.5 26.0 - 34.0 pg  ? MCHC  33.0 30.0 - 36.0 g/dL  ? RDW 13.2 11.5 - 15.5 %  ? Platelets 249 150 - 400 K/uL  ? nRBC 0.0 0.0 - 0.2 %  ?  Comment: Performed at [facility], [address] [place], [place] [address]  ?APTT     Status: Abnor

## 2021-05-19 ENCOUNTER — Encounter (HOSPITAL_COMMUNITY): Payer: Self-pay | Admitting: General Surgery

## 2021-05-19 DIAGNOSIS — I10 Essential (primary) hypertension: Secondary | ICD-10-CM | POA: Diagnosis not present

## 2021-05-19 DIAGNOSIS — R0789 Other chest pain: Secondary | ICD-10-CM | POA: Diagnosis not present

## 2021-05-19 DIAGNOSIS — A419 Sepsis, unspecified organism: Secondary | ICD-10-CM | POA: Diagnosis not present

## 2021-05-19 DIAGNOSIS — N179 Acute kidney failure, unspecified: Secondary | ICD-10-CM | POA: Diagnosis not present

## 2021-05-19 LAB — HEPATIC FUNCTION PANEL
ALT: 45 U/L — ABNORMAL HIGH (ref 0–44)
AST: 31 U/L (ref 15–41)
Albumin: 2.7 g/dL — ABNORMAL LOW (ref 3.5–5.0)
Alkaline Phosphatase: 108 U/L (ref 38–126)
Bilirubin, Direct: 0.3 mg/dL — ABNORMAL HIGH (ref 0.0–0.2)
Indirect Bilirubin: 0.4 mg/dL (ref 0.3–0.9)
Total Bilirubin: 0.7 mg/dL (ref 0.3–1.2)
Total Protein: 6.7 g/dL (ref 6.5–8.1)

## 2021-05-19 LAB — CBC
HCT: 33.4 % — ABNORMAL LOW (ref 39.0–52.0)
Hemoglobin: 10.6 g/dL — ABNORMAL LOW (ref 13.0–17.0)
MCH: 28.6 pg (ref 26.0–34.0)
MCHC: 31.7 g/dL (ref 30.0–36.0)
MCV: 90 fL (ref 80.0–100.0)
Platelets: 259 10*3/uL (ref 150–400)
RBC: 3.71 MIL/uL — ABNORMAL LOW (ref 4.22–5.81)
RDW: 13.2 % (ref 11.5–15.5)
WBC: 18 10*3/uL — ABNORMAL HIGH (ref 4.0–10.5)
nRBC: 0 % (ref 0.0–0.2)

## 2021-05-19 LAB — MAGNESIUM: Magnesium: 2.1 mg/dL (ref 1.7–2.4)

## 2021-05-19 LAB — BASIC METABOLIC PANEL
Anion gap: 8 (ref 5–15)
BUN: 17 mg/dL (ref 8–23)
CO2: 24 mmol/L (ref 22–32)
Calcium: 8.3 mg/dL — ABNORMAL LOW (ref 8.9–10.3)
Chloride: 104 mmol/L (ref 98–111)
Creatinine, Ser: 1.57 mg/dL — ABNORMAL HIGH (ref 0.61–1.24)
GFR, Estimated: 46 mL/min — ABNORMAL LOW (ref >60)
Glucose, Bld: 147 mg/dL — ABNORMAL HIGH (ref 70–99)
Potassium: 4.2 mmol/L (ref 3.5–5.1)
Sodium: 136 mmol/L (ref 135–145)

## 2021-05-19 LAB — SURGICAL PATHOLOGY

## 2021-05-19 MED ORDER — AMOXICILLIN-POT CLAVULANATE 875-125 MG PO TABS: 1.0000 | ORAL_TABLET | Freq: Two times a day (BID) | ORAL | 0 refills | Status: DC

## 2021-05-19 MED ORDER — HYDROCODONE-ACETAMINOPHEN 5-325 MG PO TABS: 1.0000 | ORAL_TABLET | Freq: Four times a day (QID) | ORAL | 0 refills | Status: DC | PRN

## 2021-05-19 NOTE — Progress Notes (Signed)
1 Day Post-Op  ?Subjective: ?Patient seen and evaluated. He says that he is doing well today, but is a little tired because he didn't fall asleep until 4am. He reports some abdominal pain that is 2 or 3 out of 10, but is well controlled with pain medication. He has been out of bed to go to the bathroom and ambulates without issue. He has been passing flatus as of this morning, but has not had a bowel movement yet. He ate breakfast this morning. He denies nausea.  ? ?Objective: ?Vital signs in last 24 hours: ?Temp:  [97.8 ?F (36.6 ?C)-99.9 ?F (37.7 ?C)] 97.8 ?F (36.6 ?C) (05/09 0601) ?Pulse Rate:  [95-110] 95 (05/09 0601) ?Resp:  [16-22] 17 (05/09 0601) ?BP: (93-120)/(64-81) 105/78 (05/09 0601) ?SpO2:  [90 %-100 %] 91 % (05/09 0601) ?Last BM Date : 05/16/21 ? ?Intake/Output from previous day: ?05/08 0701 - 05/09 0700 ?In: 3081.8 [P.O.:480; I.V.:2439; IV Piggyback:162.8] ?Out: 10 [Blood:10] ?Intake/Output this shift: ?No intake/output data recorded. ? ?Physical Exam ?Constitutional: Awake, alert, and pleasant. Sitting in bed eating breakfast. No acute distress. ?Eyes: EOMI, non-icteric sclera. ?Abdomen: Incision sites clean and dry. No surrounding erythema. ?Skin: Warm and dry. ?Neuro: AAOx3. No focal deficits on exam. ?Psych: Normal mood and affect. ? ?Lab Results:  ?Recent Labs  ?  05/18/21 ?0505 05/19/21 ?0454  ?WBC 24.7* 18.0*  ?HGB 11.3* 10.6*  ?HCT 34.2* 33.4*  ?PLT 226 259  ? ? ?  Latest Ref Rng & Units 05/19/2021  ?  4:54 AM 05/18/2021  ?  5:05 AM 05/17/2021  ?  4:57 AM  ?CMP  ?Glucose 70 - 99 mg/dL 431   540   086    ?BUN 8 - 23 mg/dL 17   18   14     ?Creatinine 0.61 - 1.24 mg/dL   7.61   9.50    ?Sodium 135 - 145 mmol/L 136   133   134    ?Potassium 3.5 - 5.1 mmol/L 4.2   4.2   4.1    ?Chloride 98 - 111 mmol/L 104   100   103    ?CO2 22 - 32 mmol/L 24   26   24     ?Calcium 8.9 - 10.3 mg/dL 8.3   8.3   8.4    ?Total Protein 6.5 - 8.1 g/dL 6.7   6.4   6.8    ?Total Bilirubin 0.3 - 1.2 mg/dL 0.7   0.9   1.0     ?Alkaline Phos 38 - 126 U/L 108   99   88    ?AST 15 - 41 U/L 31   20   34    ?ALT 0 - 44 U/L 45   36   55    ? ?Albumin 2.7 ?Mg 2.1 ? ?Anti-infectives: ?Anti-infectives (From admission, onward)  ? ? Start     Dose/Rate Route Frequency Ordered Stop  ? 05/19/21 0000  amoxicillin-clavulanate (AUGMENTIN) 875-125 MG tablet       ? 1 tablet Oral 2 times daily 05/19/21 0834    ? 05/17/21 0600  piperacillin-tazobactam (ZOSYN) IVPB 3.375 g       ? 3.375 g ?12.5 mL/hr over 240 Minutes Intravenous Every 8 hours 05/16/21 2352    ? 05/16/21 2045  piperacillin-tazobactam (ZOSYN) IVPB 3.375 g       ? 3.375 g ?100 mL/hr over 30 Minutes Intravenous  Once 05/16/21 2040 05/16/21 2113  ? ?  ? ? ?Assessment/Plan: ?s/p Procedure(s): ?LAPAROSCOPIC CHOLECYSTECTOMY ? ?  KJ IMBERT is a 71 y.o. male who is POD#1 from laparoscopic cholecystectomy.  ? ?Neuro ?- Tylenol 650mg  q6h prn ?- Norco/Vicodin 1-2 tablet q4h prn for moderate pain ?- Dilaudid 1mg  IV q3h for moderate/severe pain ?- Ativan 1mg  IV q4h prn for anxiety ?Pulmonary ?- IS ?Cardiac ?- cardiac monitoring ?GI ?- Heart healthy diet ?- Zofran 4mg  q6h prn ?- Protonix 40mg  IV bid ?- simethicone 40mg  q6h prn ?- AST wnl, ALT 45 ?GU ?- tamsulosin 0.4mg  daily ?- creatinine down to 1.57 from 1.79 yesterday ?- BMP ?Heme ?- WBC down to 18.0 from 24.7 yesterday. Patient afebrile over last 24hrs ?- CBC ?ID ?- Continue Zosyn, transition to oral antibiotics at discharge ?Prophylaxis ?- SCDs ?Disposition ?- Will continue to monitor today. Possible discharge later today or tomorrow. ? ? LOS: 3 days  ? ? ? , Medical Student ?05/19/2021  ?

## 2021-05-21 DIAGNOSIS — I10 Essential (primary) hypertension: Secondary | ICD-10-CM | POA: Diagnosis not present

## 2021-05-21 DIAGNOSIS — K810 Acute cholecystitis: Secondary | ICD-10-CM | POA: Diagnosis not present

## 2021-05-28 ENCOUNTER — Encounter: Payer: Self-pay | Admitting: General Surgery

## 2021-05-28 ENCOUNTER — Ambulatory Visit (INDEPENDENT_AMBULATORY_CARE_PROVIDER_SITE_OTHER): Payer: Medicare PPO | Admitting: General Surgery

## 2021-05-28 DIAGNOSIS — Z09 Encounter for follow-up examination after completed treatment for conditions other than malignant neoplasm: Secondary | ICD-10-CM

## 2021-05-28 NOTE — Progress Notes (Signed)
Subjective:     Johnny Johns  Postoperative telephone visit performed with patient.  Patient states he is doing very well.  He has minimal incisional pain.  He denies any nausea or vomiting. Objective:    There were no vitals taken for this visit.  General:  Virtual visit       Assessment:    Doing well postoperatively.    Plan:   Patient was told to follow-up with me as needed.  He is pleased with the results.  As this was a part of the total global surgical fee, this was not a billable visit.  Total telephone time was 3 minutes.

## 2021-06-01 DIAGNOSIS — Z4789 Encounter for other orthopedic aftercare: Secondary | ICD-10-CM | POA: Diagnosis not present

## 2021-06-01 DIAGNOSIS — Z9049 Acquired absence of other specified parts of digestive tract: Secondary | ICD-10-CM | POA: Diagnosis not present

## 2021-06-01 DIAGNOSIS — Z981 Arthrodesis status: Secondary | ICD-10-CM | POA: Diagnosis not present

## 2021-07-16 ENCOUNTER — Ambulatory Visit: Payer: Medicare PPO | Admitting: Urology

## 2021-07-16 ENCOUNTER — Encounter: Payer: Self-pay | Admitting: Urology

## 2021-07-16 ENCOUNTER — Ambulatory Visit (HOSPITAL_COMMUNITY)
Admission: RE | Admit: 2021-07-16 | Discharge: 2021-07-16 | Disposition: A | Discharge status: Home / Self Care | Payer: Medicare PPO | Source: Ambulatory Visit | Attending: Urology | Admitting: Urology

## 2021-07-16 VITALS — BP 110/73 | HR 81 | Ht 70.0 in | Wt 243.2 lb

## 2021-07-16 DIAGNOSIS — N138 Other obstructive and reflux uropathy: Secondary | ICD-10-CM | POA: Diagnosis not present

## 2021-07-16 DIAGNOSIS — N201 Calculus of ureter: Secondary | ICD-10-CM | POA: Insufficient documentation

## 2021-07-16 DIAGNOSIS — N401 Enlarged prostate with lower urinary tract symptoms: Secondary | ICD-10-CM

## 2021-07-16 LAB — MICROSCOPIC EXAMINATION
Bacteria, UA: NONE SEEN
Epithelial Cells (non renal): NONE SEEN /hpf (ref 0–10)
Renal Epithel, UA: NONE SEEN /hpf
WBC, UA: NONE SEEN /hpf (ref 0–5)

## 2021-07-16 LAB — URINALYSIS, ROUTINE W REFLEX MICROSCOPIC
Bilirubin, UA: NEGATIVE
Glucose, UA: NEGATIVE
Ketones, UA: NEGATIVE
Leukocytes,UA: NEGATIVE
Nitrite, UA: NEGATIVE
Specific Gravity, UA: >1.03 — ABNORMAL HIGH (ref 1.005–1.030)
Urobilinogen, Ur: 0.2 mg/dL (ref 0.2–1.0)
pH, UA: 5 (ref 5.0–7.5)

## 2021-07-16 NOTE — Progress Notes (Signed)
Assessment: 1. Ureteral calculus, right   2. BPH with obstruction/lower urinary tract symptoms     Plan: I reviewed the CT study from 05/15/2021 showing a right distal ureteral calculus without evidence of obstruction. Continue tamsulosin. KUB ordered for further evaluation of the right ureteral calculus. Return to office in 4-6 weeks   Chief Complaint: Chief Complaint  Patient presents with   Follow-up    3 MONTH FOLLOW UP    ureteral calculus    HPI: Johnny Johns is a 71 y.o. male who presents for continued evaluation of a right ureteral calculus.  He underwent cystoscopy with right ureteroscopy and right ureteral stent placement on 02/06/2021.  He developed urinary retention postoperatively requiring placement of a Foley catheter in the emergency department on 02/07/2021.  He had approximately 700 mL in his bladder.  He was started on tamsulosin on 02/06/2021.  No problems voiding prior to his procedure.  He subsequently underwent right ESL on 02/10/2021.   His Foley was removed on 02/12/2021 after a successful voiding trial.  KUB from 02/19/21 showed a right ureteral stent in good position and evidence of fragmentation of the proximal right ureteral calculus. His stent was not removed on 02/19/2021 due to concern for possible UTI.  Urine culture showed no growth. He was treated with Cipro x5 days. His right ureteral stent was removed on 03/02/2021. KUB from 03/13/2021 showed no obvious calcification along the expected course of the right ureter. He passed a number of small stone fragments.  He continued on tamsulosin with good control of his lower urinary tract symptoms. IPSS = 5. Stone analysis: 80% calcium oxalate monohydrate, 20% calcium oxalate dihydrate.  Renal ultrasound from 04/06/2021 showed mild right hydronephrosis. At his visit in 4/23, he was doing well. No flank pain.  He was not aware of passing any additional stone fragments.  No dysuria or gross hematuria.  He continued  on tamsulosin with good control of his urinary symptoms. IPSS = 8. BMP showed Cr of 1.20. CT chest, abdomen, and pelvis from 05/15/2021 showed a 6 mm calculus in the right distal ureter without evidence of hydronephrosis.  He elected to attempt spontaneous passage at that time.  He continued on tamsulosin.  He returns today for follow-up.  He is not aware of passing a stone.  He is not having any flank pain.  No dysuria or gross hematuria.  He continues on tamsulosin. IPSS = 8 today.  Portions of the above documentation were copied from a prior visit for review purposes only.  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   Presented with acute myocardial infarction In 01/2003; isolated 90% proximal LAD lesion treated with DES; EF 45-50%   Degenerative joint disease    Chronic neck and back pain   Hyperlipidemia    lipid profile in 06/2010:195, 207, 37, 117   Hypertension    IBS (irritable bowel syndrome)    Myocardial infarction (HCC)    PUD (peptic ulcer disease)    hx   Tobacco abuse, in remission     PSH: Past Surgical History:  Procedure Laterality Date   CARDIAC CATHETERIZATION  stent-2005   CHOLECYSTECTOMY N/A 05/18/2021   Procedure: LAPAROSCOPIC CHOLECYSTECTOMY;  Surgeon: [name], MD;  Location: [facility];  Service: General;  Laterality: N/A;   COLONOSCOPY  01/11/2006   CYSTOSCOPY W/ URETERAL STENT PLACEMENT Right 02/06/2021   Procedure: CYSTOSCOPY WITH RIGHT RETROGRADE PYELOGRAM/ RIGHT URETERAL STENT PLACEMENT/ RIGHT URETEROSCOPY;  Surgeon: Milderd Meager., MD;  Location: [facility];  Service: Urology;  Laterality: Right;    EXTRACORPOREAL SHOCK WAVE LITHOTRIPSY Right 02/10/2021   Procedure: EXTRACORPOREAL SHOCK WAVE LITHOTRIPSY (ESWL);  Surgeon: Milderd Meager., MD;  Location: [facility];  Service: Urology;  Laterality: Right;   KIDNEY STONE SURGERY  01/12/1988    SH: Social History   Tobacco Use   Smoking status: Former    Packs/day: 1.50    Years: 20.00    Total pack years: 30.00    Types: Cigarettes    Quit date: 01/18/2003    Years since quitting: 18.5   Smokeless tobacco: Never  Vaping Use   Vaping Use: Never used  Substance Use Topics   Alcohol use: No   Drug use: No    ROS: Constitutional:  Negative for fever, chills, weight loss CV: Negative for chest pain, previous MI, hypertension Respiratory:  Negative for shortness of breath, wheezing, sleep apnea, frequent cough GI:  Negative for nausea, vomiting, bloody stool, GERD  PE: BP 110/73   Pulse 81   Ht 5\' 10"  (1.778 m)   Wt 243 lb 3.2 oz (110.3 kg)   BMI 34.90 kg/m  GENERAL APPEARANCE:  Well appearing, well developed, well nourished, NAD HEENT:  Atraumatic, normocephalic, oropharynx clear NECK:  Supple without lymphadenopathy or thyromegaly ABDOMEN:  Soft, non-tender, no masses EXTREMITIES:  Moves all extremities well, without clubbing, cyanosis, or edema NEUROLOGIC:  Alert and oriented x 3, normal gait, CN II-XII grossly intact MENTAL STATUS:  appropriate BACK:  Non-tender to palpation, No CVAT SKIN:  Warm, dry, and intact   Results: U/A: 0-2 RBC

## 2021-07-16 NOTE — H&P (View-Only) (Signed)
 Assessment: 1. Ureteral calculus, right   2. BPH with obstruction/lower urinary tract symptoms     Plan: I reviewed the CT study from 05/15/2021 showing a right distal ureteral calculus without evidence of obstruction. Continue tamsulosin. KUB ordered for further evaluation of the right ureteral calculus. Return to office in 4-6 weeks   Chief Complaint: Chief Complaint  Patient presents with   Follow-up    3 MONTH FOLLOW UP    ureteral calculus    HPI: Johnny Johns is a 71 y.o. male who presents for continued evaluation of a right ureteral calculus.  He underwent cystoscopy with right ureteroscopy and right ureteral stent placement on 02/06/2021.  He developed urinary retention postoperatively requiring placement of a Foley catheter in the emergency department on 02/07/2021.  He had approximately 700 mL in his bladder.  He was started on tamsulosin on 02/06/2021.  No problems voiding prior to his procedure.  He subsequently underwent right ESL on 02/10/2021.   His Foley was removed on 02/12/2021 after a successful voiding trial.  KUB from 02/19/21 showed a right ureteral stent in good position and evidence of fragmentation of the proximal right ureteral calculus. His stent was not removed on 02/19/2021 due to concern for possible UTI.  Urine culture showed no growth. He was treated with Cipro x5 days. His right ureteral stent was removed on 03/02/2021. KUB from 03/13/2021 showed no obvious calcification along the expected course of the right ureter. He passed a number of small stone fragments.  He continued on tamsulosin with good control of his lower urinary tract symptoms. IPSS = 5. Stone analysis: 80% calcium oxalate monohydrate, 20% calcium oxalate dihydrate.  Renal ultrasound from 04/06/2021 showed mild right hydronephrosis. At his visit in 4/23, he was doing well. No flank pain.  He was not aware of passing any additional stone fragments.  No dysuria or gross hematuria.  He continued  on tamsulosin with good control of his urinary symptoms. IPSS = 8. BMP showed Cr of 1.20. CT chest, abdomen, and pelvis from 05/15/2021 showed a 6 mm calculus in the right distal ureter without evidence of hydronephrosis.  He elected to attempt spontaneous passage at that time.  He continued on tamsulosin.  He returns today for follow-up.  He is not aware of passing a stone.  He is not having any flank pain.  No dysuria or gross hematuria.  He continues on tamsulosin. IPSS = 8 today.  Portions of the above documentation were copied from a prior visit for review purposes only.  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   Presented with acute myocardial infarction In 01/2003; isolated 90% proximal LAD lesion treated with DES; EF 45-50%   Degenerative joint disease    Chronic neck and back pain   Hyperlipidemia    lipid profile in 06/2010:195, 207, 37, 117   Hypertension    IBS (irritable bowel syndrome)    Myocardial infarction (HCC)    PUD (peptic ulcer disease)    hx   Tobacco abuse, in remission     PSH: Past Surgical History:  Procedure Laterality Date   CARDIAC CATHETERIZATION       stent-2005   CHOLECYSTECTOMY N/A 05/18/2021   Procedure: LAPAROSCOPIC CHOLECYSTECTOMY;  Surgeon: [name], MD;  Location: [facility];  Service: General;  Laterality: N/A;   COLONOSCOPY  01/11/2006   CYSTOSCOPY W/ URETERAL STENT PLACEMENT Right 02/06/2021   Procedure: CYSTOSCOPY WITH RIGHT RETROGRADE PYELOGRAM/ RIGHT URETERAL STENT PLACEMENT/ RIGHT URETEROSCOPY;  Surgeon: Milderd Meager., MD;  Location: [facility];  Service: Urology;  Laterality: Right;    EXTRACORPOREAL SHOCK WAVE LITHOTRIPSY Right 02/10/2021   Procedure: EXTRACORPOREAL SHOCK WAVE LITHOTRIPSY (ESWL);  Surgeon: Milderd Meager., MD;  Location: [facility];  Service: Urology;  Laterality: Right;   KIDNEY STONE SURGERY  01/12/1988    SH: Social History   Tobacco Use   Smoking status: Former    Packs/day: 1.50    Years: 20.00    Total pack years: 30.00    Types: Cigarettes    Quit date: 01/18/2003    Years since quitting: 18.5   Smokeless tobacco: Never  Vaping Use   Vaping Use: Never used  Substance Use Topics   Alcohol use: No   Drug use: No    ROS: Constitutional:  Negative for fever, chills, weight loss CV: Negative for chest pain, previous MI, hypertension Respiratory:  Negative for shortness of breath, wheezing, sleep apnea, frequent cough GI:  Negative for nausea, vomiting, bloody stool, GERD  PE: BP 110/73   Pulse 81   Ht 5\' 10"  (1.778 m)   Wt 243 lb 3.2 oz (110.3 kg)   BMI 34.90 kg/m  GENERAL APPEARANCE:  Well appearing, well developed, well nourished, NAD HEENT:  Atraumatic, normocephalic, oropharynx clear NECK:  Supple without lymphadenopathy or thyromegaly ABDOMEN:  Soft, non-tender, no masses EXTREMITIES:  Moves all extremities well, without clubbing, cyanosis, or edema NEUROLOGIC:  Alert and oriented x 3, normal gait, CN II-XII grossly intact MENTAL STATUS:  appropriate BACK:  Non-tender to palpation, No CVAT SKIN:  Warm, dry, and intact   Results: U/A: 0-2 RBC

## 2021-07-20 ENCOUNTER — Telehealth: Payer: Self-pay

## 2021-07-20 ENCOUNTER — Other Ambulatory Visit: Payer: Self-pay

## 2021-07-20 DIAGNOSIS — N201 Calculus of ureter: Secondary | ICD-10-CM

## 2021-07-20 NOTE — Telephone Encounter (Signed)
I spoke with Johnny Johns. We have discussed possible surgery dates and 07/28/2021 was agreed upon by all parties. Patient given information about surgery date, what to expect pre-operatively and post operatively.    We discussed that a pre-op nurse will be calling to set up the pre-op visit that will take place prior to surgery. Informed patient that our office will communicate any additional care to be provided after surgery.    Patients questions or concerns were discussed during our call. Advised to call our office should there be any additional information, questions or concerns that arise. Patient verbalized understanding.

## 2021-07-23 ENCOUNTER — Encounter (HOSPITAL_COMMUNITY)
Admission: RE | Admit: 2021-07-23 | Discharge: 2021-07-23 | Disposition: A | Discharge status: Home / Self Care | Payer: Medicare PPO | Source: Ambulatory Visit | Attending: Urology | Admitting: Urology

## 2021-07-23 ENCOUNTER — Encounter (HOSPITAL_COMMUNITY): Payer: Self-pay

## 2021-07-23 HISTORY — DX: Personal history of urinary calculi: Z87.442

## 2021-07-27 DIAGNOSIS — Z981 Arthrodesis status: Secondary | ICD-10-CM | POA: Diagnosis not present

## 2021-07-27 DIAGNOSIS — M5489 Other dorsalgia: Secondary | ICD-10-CM | POA: Diagnosis not present

## 2021-07-27 DIAGNOSIS — Z4789 Encounter for other orthopedic aftercare: Secondary | ICD-10-CM | POA: Diagnosis not present

## 2021-07-28 ENCOUNTER — Encounter (HOSPITAL_COMMUNITY): Payer: Self-pay | Admitting: Urology

## 2021-07-28 ENCOUNTER — Encounter (HOSPITAL_COMMUNITY)
Admission: RE | Disposition: A | Discharge status: Home / Self Care | Payer: Self-pay | Source: Home / Self Care | Attending: Urology

## 2021-07-28 ENCOUNTER — Ambulatory Visit (HOSPITAL_COMMUNITY): Payer: Medicare PPO

## 2021-07-28 ENCOUNTER — Ambulatory Visit (HOSPITAL_COMMUNITY)
Admission: RE | Admit: 2021-07-28 | Discharge: 2021-07-28 | Disposition: A | Discharge status: Home / Self Care | Payer: Medicare PPO | Attending: Urology | Admitting: Urology

## 2021-07-28 DIAGNOSIS — I10 Essential (primary) hypertension: Secondary | ICD-10-CM | POA: Insufficient documentation

## 2021-07-28 DIAGNOSIS — N201 Calculus of ureter: Secondary | ICD-10-CM | POA: Diagnosis not present

## 2021-07-28 DIAGNOSIS — Z955 Presence of coronary angioplasty implant and graft: Secondary | ICD-10-CM | POA: Diagnosis not present

## 2021-07-28 DIAGNOSIS — N401 Enlarged prostate with lower urinary tract symptoms: Secondary | ICD-10-CM | POA: Diagnosis not present

## 2021-07-28 DIAGNOSIS — N200 Calculus of kidney: Secondary | ICD-10-CM

## 2021-07-28 DIAGNOSIS — R339 Retention of urine, unspecified: Secondary | ICD-10-CM | POA: Diagnosis not present

## 2021-07-28 HISTORY — PX: EXTRACORPOREAL SHOCK WAVE LITHOTRIPSY: SHX1557

## 2021-07-28 SURGERY — LITHOTRIPSY, ESWL
Anesthesia: LOCAL | Laterality: Right

## 2021-07-28 MED ORDER — DIPHENHYDRAMINE HCL 25 MG PO CAPS
25.0000 mg | ORAL_CAPSULE | ORAL | Status: AC
  Administered 2021-07-28: 25 mg via ORAL
  Filled 2021-07-28: qty 1

## 2021-07-28 MED ORDER — SODIUM CHLORIDE 0.9 % IV SOLN: INTRAVENOUS | Status: DC

## 2021-07-28 MED ORDER — HYDROCODONE-ACETAMINOPHEN 5-325 MG PO TABS: 1.0000 | ORAL_TABLET | Freq: Four times a day (QID) | ORAL | 0 refills | Status: DC | PRN

## 2021-07-28 MED ORDER — DIAZEPAM 5 MG PO TABS
10.0000 mg | ORAL_TABLET | Freq: Once | ORAL | Status: AC
Start: 2021-07-28 — End: 2021-07-28
  Administered 2021-07-28: 10 mg via ORAL
  Filled 2021-07-28: qty 2

## 2021-07-28 NOTE — Interval H&P Note (Signed)
History and Physical Interval Note:  07/28/2021 8:54 AM  Johnny Johns  has presented today for surgery, with the diagnosis of right ureteral calculus.  The various methods of treatment have been discussed with the patient and family. After consideration of risks, benefits and other options for treatment, the patient has consented to  Procedure(s): EXTRACORPOREAL SHOCK WAVE LITHOTRIPSY (ESWL) (Right) as a surgical intervention.  The patient's history has been reviewed, patient examined, no change in status, stable for surgery.  I have reviewed the patient's chart and labs.  Questions were answered to the patient's satisfaction.     Di Kindle

## 2021-07-29 ENCOUNTER — Encounter (HOSPITAL_COMMUNITY): Payer: Self-pay | Admitting: Urology

## 2021-08-06 ENCOUNTER — Emergency Department (HOSPITAL_COMMUNITY): Payer: Medicare PPO

## 2021-08-13 ENCOUNTER — Ambulatory Visit (HOSPITAL_COMMUNITY)
Admission: RE | Admit: 2021-08-13 | Discharge: 2021-08-13 | Disposition: A | Discharge status: Home / Self Care | Payer: Medicare PPO | Source: Ambulatory Visit | Attending: Urology | Admitting: Urology

## 2021-08-13 ENCOUNTER — Ambulatory Visit (INDEPENDENT_AMBULATORY_CARE_PROVIDER_SITE_OTHER): Payer: Medicare PPO | Admitting: Urology

## 2021-08-13 ENCOUNTER — Encounter: Payer: Self-pay | Admitting: Urology

## 2021-08-13 VITALS — BP 145/86 | HR 79 | Ht 70.0 in | Wt 240.0 lb

## 2021-08-13 DIAGNOSIS — N201 Calculus of ureter: Secondary | ICD-10-CM | POA: Insufficient documentation

## 2021-08-13 DIAGNOSIS — N138 Other obstructive and reflux uropathy: Secondary | ICD-10-CM

## 2021-08-13 DIAGNOSIS — N401 Enlarged prostate with lower urinary tract symptoms: Secondary | ICD-10-CM

## 2021-08-13 LAB — URINALYSIS, ROUTINE W REFLEX MICROSCOPIC
Bilirubin, UA: NEGATIVE
Glucose, UA: NEGATIVE
Ketones, UA: NEGATIVE
Leukocytes,UA: NEGATIVE
Nitrite, UA: NEGATIVE
Specific Gravity, UA: 1.025 (ref 1.005–1.030)
Urobilinogen, Ur: 0.2 mg/dL (ref 0.2–1.0)
pH, UA: 5 (ref 5.0–7.5)

## 2021-08-13 LAB — MICROSCOPIC EXAMINATION
Bacteria, UA: NONE SEEN
Renal Epithel, UA: NONE SEEN /HPF
WBC, UA: NONE SEEN /HPF (ref 0–5)

## 2021-08-13 NOTE — Progress Notes (Signed)
Assessment: 1. Ureteral calculus, right   2. BPH with obstruction/lower urinary tract symptoms     Plan: Continue tamsulosin. KUB today. We will contact him with results of the KUB and recommendations for continued management of the right distal ureteral calculus.  Chief Complaint: Chief Complaint  Patient presents with   ureteral calculus    HPI: Johnny Johns is a 71 y.o. male who presents for continued evaluation of a right ureteral calculus.  He underwent cystoscopy with right ureteroscopy and right ureteral stent placement on 02/06/2021.  He developed urinary retention postoperatively requiring placement of a Foley catheter in the emergency department on 02/07/2021.  He had approximately 700 mL in his bladder.  He was started on tamsulosin on 02/06/2021.  No problems voiding prior to his procedure.  He subsequently underwent right ESL on 02/10/2021.   His Foley was removed on 02/12/2021 after a successful voiding trial.  KUB from 02/19/21 showed a right ureteral stent in good position and evidence of fragmentation of the proximal right ureteral calculus. His stent was not removed on 02/19/2021 due to concern for possible UTI.  Urine culture showed no growth. He was treated with Cipro x5 days. His right ureteral stent was removed on 03/02/2021. KUB from 03/13/2021 showed no obvious calcification along the expected course of the right ureter. He passed a number of small stone fragments.  He continued on tamsulosin with good control of his lower urinary tract symptoms. IPSS = 5. Stone analysis: 80% calcium oxalate monohydrate, 20% calcium oxalate dihydrate.  Renal ultrasound from 04/06/2021 showed mild right hydronephrosis. At his visit in 4/23, he was doing well. No flank pain.  He was not aware of passing any additional stone fragments.  No dysuria or gross hematuria.  He continued on tamsulosin with good control of his urinary symptoms. IPSS = 8. BMP showed Cr of 1.20. CT chest,  abdomen, and pelvis from 05/15/2021 showed a 6 mm calculus in the right distal ureter without evidence of hydronephrosis.  He elected to attempt spontaneous passage at that time.  He continued on tamsulosin. KUB from 07/16/2021 showed a persistent calcification in the area of the right distal ureter. He underwent right ESL on 07/28/2021.  He returns today for follow-up.  He has not passed any fragments since the lithotripsy.  He is not having any flank pain.  No dysuria or gross hematuria.  He continues on tamsulosin and reports good control of his urinary symptoms.   Portions of the above documentation were copied from a prior visit for review purposes only.  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   Presented with acute myocardial infarction In 01/2003; isolated 90% proximal LAD lesion treated with DES; EF 45-50%   Degenerative joint disease    Chronic neck and back pain   History of kidney stones    Hyperlipidemia    lipid profile in 06/2010:195, 207, 37, 117   Hypertension    IBS (irritable bowel syndrome)    Myocardial infarction (HCC)    PUD (peptic ulcer disease)    hx   Tobacco abuse, in remission     PSH:  Past Surgical History:  Procedure Laterality Date   CARDIAC CATHETERIZATION     stent-2005   CERVICAL FUSION  04/2021   CHOLECYSTECTOMY N/A 05/18/2021   Procedure: LAPAROSCOPIC CHOLECYSTECTOMY;  Surgeon: [name], MD;  Location: [facility];  Service: General;  Laterality: N/A;   COLONOSCOPY  01/11/2006   CYSTOSCOPY W/ URETERAL STENT PLACEMENT Right 02/06/2021   Procedure: CYSTOSCOPY WITH RIGHT RETROGRADE PYELOGRAM/ RIGHT URETERAL  STENT PLACEMENT/ RIGHT URETEROSCOPY;  Surgeon: Milderd Meager., MD;  Location: [facility];  Service: Urology;  Laterality: Right;   EXTRACORPOREAL SHOCK WAVE LITHOTRIPSY Right 02/10/2021   Procedure: EXTRACORPOREAL SHOCK WAVE LITHOTRIPSY (ESWL);  Surgeon: Milderd Meager., MD;  Location: [facility];  Service: Urology;  Laterality: Right;   EXTRACORPOREAL SHOCK WAVE LITHOTRIPSY Right 07/28/2021   Procedure: EXTRACORPOREAL SHOCK WAVE LITHOTRIPSY (ESWL);  Surgeon: Milderd Meager., MD;  Location: [facility];  Service: Urology;  Laterality: Right;   KIDNEY STONE SURGERY  01/12/1988    SH: Social History   Tobacco Use   Smoking status: Former    Packs/day: 1.50    Years: 20.00    Total pack years: 30.00    Types: Cigarettes    Quit date: 01/18/2003    Years since quitting: 18.5   Smokeless tobacco: Never  Vaping Use   Vaping Use: Never used  Substance Use Topics   Alcohol use: No   Drug use: No    ROS: Constitutional:  Negative for fever, chills, weight loss CV: Negative for chest pain, previous MI, hypertension Respiratory:  Negative for shortness of breath, wheezing, sleep apnea, frequent cough GI:  Negative for nausea, vomiting, bloody stool, GERD  PE: BP (!) 145/86   Pulse 79   Ht 5\' 10"  (1.778 m)   Wt 240 lb (108.9 kg)   BMI 34.44 kg/m  GENERAL APPEARANCE:  Well appearing, well developed, well nourished, NAD HEENT:  Atraumatic, normocephalic, oropharynx clear NECK:  Supple without lymphadenopathy or thyromegaly ABDOMEN:  Soft, non-tender, no masses EXTREMITIES:  Moves all extremities well, without clubbing, cyanosis, or edema NEUROLOGIC:  Alert and oriented x 3, normal gait, CN II-XII grossly intact MENTAL STATUS:  appropriate BACK:  Non-tender to palpation, No CVAT SKIN:  Warm, dry, and intact   Results: U/A: 0-2 RBCs

## 2021-08-18 DIAGNOSIS — Z4789 Encounter for other orthopedic aftercare: Secondary | ICD-10-CM | POA: Diagnosis not present

## 2021-08-18 DIAGNOSIS — M542 Cervicalgia: Secondary | ICD-10-CM | POA: Diagnosis not present

## 2021-08-19 ENCOUNTER — Encounter: Payer: Self-pay | Admitting: Urology

## 2021-08-21 DIAGNOSIS — Z4789 Encounter for other orthopedic aftercare: Secondary | ICD-10-CM | POA: Diagnosis not present

## 2021-08-21 DIAGNOSIS — M542 Cervicalgia: Secondary | ICD-10-CM | POA: Diagnosis not present

## 2021-08-25 DIAGNOSIS — M542 Cervicalgia: Secondary | ICD-10-CM | POA: Diagnosis not present

## 2021-08-25 DIAGNOSIS — Z4789 Encounter for other orthopedic aftercare: Secondary | ICD-10-CM | POA: Diagnosis not present

## 2021-08-27 DIAGNOSIS — M542 Cervicalgia: Secondary | ICD-10-CM | POA: Diagnosis not present

## 2021-08-27 DIAGNOSIS — Z4789 Encounter for other orthopedic aftercare: Secondary | ICD-10-CM | POA: Diagnosis not present

## 2021-08-28 ENCOUNTER — Encounter: Payer: Self-pay | Admitting: Urology

## 2021-08-28 ENCOUNTER — Ambulatory Visit: Payer: Medicare PPO | Admitting: Urology

## 2021-08-28 VITALS — BP 119/75 | HR 73

## 2021-08-28 DIAGNOSIS — N201 Calculus of ureter: Secondary | ICD-10-CM

## 2021-08-28 LAB — MICROSCOPIC EXAMINATION: Bacteria, UA: NONE SEEN

## 2021-08-28 LAB — URINALYSIS, ROUTINE W REFLEX MICROSCOPIC
Bilirubin, UA: NEGATIVE
Glucose, UA: NEGATIVE
Leukocytes,UA: NEGATIVE
Nitrite, UA: NEGATIVE
RBC, UA: NEGATIVE
Specific Gravity, UA: >1.03 — ABNORMAL HIGH (ref 1.005–1.030)
Urobilinogen, Ur: 0.2 mg/dL (ref 0.2–1.0)
pH, UA: 5 (ref 5.0–7.5)

## 2021-08-28 NOTE — H&P (View-Only) (Signed)
Assessment: 1. Ureteral calculus, right     Plan: Continue tamsulosin. Strain urine I personally reviewed the KUB study from 08/13/2021 showing a persistent calcification in the area of the right distal ureter.  I discussed the findings of a persistent right ureteral calculus following ESL and options for management.  Given failure of the calculus to pass following treatment with lithotripsy, I recommended management with ureteroscopic laser lithotripsy. Risks, benefits, alternatives were discussed with the patient in detail.  Potential risks including, but not limited to, infection; bleeding;  injury to urethra, bladder, or ureter; possible need of other treatments; possible failure to remove the calculus; ureteral  stricture formation; cardiac, pulmonary, cerebrovascular events; and anesthetic complications were discussed.  The patient understands and wishes to proceed. BMP today  Procedure: The patient will be scheduled for cystoscopy, right retrograde pyelogram, right ureteroscopy, holmium laser lithotripsy, insertion of right ureteral stent at [facility].  Surgical request is placed with the surgery schedulers and will be scheduled at the patient's/family request. Informed consent is given as documented below. Anesthesia: General  The patient does not have sleep apnea, history of MRSA, history of VRE, history of cardiac device requiring special anesthetic needs. Patient is stable and considered clear for surgical in an outpatient ambulatory surgery setting as well as patient hospital setting.  Consent for Operation or Procedure: Provider Certification I hereby certify that the nature, purpose, benefits, usual and most frequent risks of, and alternatives to, the operation or procedure have been explained to the patient (or person authorized to sign for the patient) either by me as responsible physician or by the provider who is to perform the operation or procedure. Time spent such that  the patient/family has had an opportunity to ask questions, and that those questions have been answered. The patient or the patient's representative has been advised that selected tasks may be performed by assistants to the primary health care provider(s). I believe that the patient (or person authorized to sign for the patient) understands what has been explained, and has consented to the operation or procedure. No guarantees were implied or made.   Chief Complaint: Chief Complaint  Patient presents with   ureteral calculus    HPI: Johnny Johns is a 71 y.o. male who presents for continued evaluation of a right ureteral calculus.  He underwent cystoscopy with right ureteroscopy and right ureteral stent placement on 02/06/2021.  He developed urinary retention postoperatively requiring placement of a Foley catheter in the emergency department on 02/07/2021.  He had approximately 700 mL in his bladder.  He was started on tamsulosin on 02/06/2021.  No problems voiding prior to his procedure.  He subsequently underwent right ESL on 02/10/2021.   His Foley was removed on 02/12/2021 after a successful voiding trial.  KUB from 02/19/21 showed a right ureteral stent in good position and evidence of fragmentation of the proximal right ureteral calculus. His stent was not removed on 02/19/2021 due to concern for possible UTI.  Urine culture showed no growth. He was treated with Cipro x5 days. His right ureteral stent was removed on 03/02/2021. KUB from 03/13/2021 showed no obvious calcification along the expected course of the right ureter. He passed a number of small stone fragments.  He continued on tamsulosin with good control of his lower urinary tract symptoms. IPSS = 5. Stone analysis: 80% calcium oxalate monohydrate, 20% calcium oxalate dihydrate.  Renal ultrasound from 04/06/2021 showed mild right hydronephrosis. At his visit in 4/23, he was doing well.  No flank pain.  He was not aware of passing any  additional stone fragments.  No dysuria or gross hematuria.  He continued on tamsulosin with good control of his urinary symptoms. IPSS = 8. BMP showed Cr of 1.20. CT chest, abdomen, and pelvis from 05/15/2021 showed a 6 mm calculus in the right distal ureter without evidence of hydronephrosis.  He elected to attempt spontaneous passage at that time.  He continued on tamsulosin. KUB from 07/16/2021 showed a persistent calcification in the area of the right distal ureter. He underwent right ESL on 07/28/2021. He has not passed any fragments since the lithotripsy.  No flank pain.  No dysuria or gross hematuria.  He continues on tamsulosin and reports good control of his urinary symptoms. KUB from 08/13/2021 showed a persistent 6 mm calcification in the area of the right distal ureter unchanged from prior studies.  He returns today for follow-up.  He has not passed a stone.  He is not having any flank pain.  No dysuria or gross hematuria.   Portions of the above documentation were copied from a prior visit for review purposes only.  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   Presented with acute myocardial infarction In 01/2003; isolated 90% proximal LAD lesion treated with DES; EF 45-50%   Degenerative joint disease    Chronic neck and back pain   History of kidney stones    Hyperlipidemia    lipid profile in 06/2010:195, 207, 37, 117   Hypertension    IBS (irritable bowel syndrome)    Myocardial infarction ([place])    PUD (peptic ulcer disease)    hx   Tobacco abuse, in remission     PSH: Past Surgical History:  Procedure Laterality Date   CARDIAC  CATHETERIZATION     stent-2005   CERVICAL FUSION  04/2021   CHOLECYSTECTOMY N/A 05/18/2021   Procedure: LAPAROSCOPIC CHOLECYSTECTOMY;  Surgeon: Aviva Signs, MD;  Location: [facility];  Service: General;  Laterality: N/A;   COLONOSCOPY  01/11/2006   CYSTOSCOPY W/ URETERAL STENT PLACEMENT Right 02/06/2021   Procedure: CYSTOSCOPY WITH RIGHT RETROGRADE PYELOGRAM/ RIGHT URETERAL STENT PLACEMENT/ RIGHT URETEROSCOPY;  Surgeon: Primus Bravo., MD;  Location: [facility];  Service: Urology;  Laterality: Right;   EXTRACORPOREAL SHOCK WAVE LITHOTRIPSY Right 02/10/2021   Procedure: EXTRACORPOREAL SHOCK WAVE LITHOTRIPSY (ESWL);  Surgeon: Primus Bravo., MD;  Location: [facility];  Service: Urology;  Laterality: Right;   EXTRACORPOREAL SHOCK WAVE LITHOTRIPSY Right 07/28/2021   Procedure: EXTRACORPOREAL SHOCK WAVE LITHOTRIPSY (ESWL);  Surgeon: Primus Bravo., MD;  Location: [facility];  Service: Urology;  Laterality: Right;   KIDNEY STONE SURGERY  01/12/1988    SH: Social History   Tobacco Use   Smoking status: Former    Packs/day: 1.50    Years: 20.00    Total pack years: 30.00    Types: Cigarettes    Quit date: 01/18/2003    Years since quitting: 18.6   Smokeless tobacco: Never  Vaping Use   Vaping Use: Never used  Substance Use Topics   Alcohol use: No   Drug use: No  ROS: Constitutional:  Negative for fever, chills, weight loss CV: Negative for chest pain, previous MI, hypertension Respiratory:  Negative for shortness of breath, wheezing, sleep apnea, frequent cough GI:  Negative for nausea, vomiting, bloody stool, GERD  PE: BP 119/75   Pulse 73  GENERAL APPEARANCE:  Well appearing, well developed, well nourished, NAD HEENT:  Atraumatic, normocephalic, oropharynx clear NECK:  Supple without lymphadenopathy or thyromegaly ABDOMEN:  Soft, non-tender, no masses EXTREMITIES:  Moves all extremities well, without clubbing, cyanosis, or edema NEUROLOGIC:  Alert and oriented x 3,  normal gait, CN II-XII grossly intact MENTAL STATUS:  appropriate BACK:  Non-tender to palpation, No CVAT SKIN:  Warm, dry, and intact   Results: U/A: 0-5 WBCs, 0-2 RBCs, no bacteria

## 2021-08-28 NOTE — Progress Notes (Signed)
Assessment: 1. Ureteral calculus, right     Plan: Continue tamsulosin. Strain urine I personally reviewed the KUB study from 08/13/2021 showing a persistent calcification in the area of the right distal ureter.  I discussed the findings of a persistent right ureteral calculus following ESL and options for management.  Given failure of the calculus to pass following treatment with lithotripsy, I recommended management with ureteroscopic laser lithotripsy. Risks, benefits, alternatives were discussed with the patient in detail.  Potential risks including, but not limited to, infection; bleeding;  injury to urethra, bladder, or ureter; possible need of other treatments; possible failure to remove the calculus; ureteral  stricture formation; cardiac, pulmonary, cerebrovascular events; and anesthetic complications were discussed.  The patient understands and wishes to proceed. BMP today  Procedure: The patient will be scheduled for cystoscopy, right retrograde pyelogram, right ureteroscopy, holmium laser lithotripsy, insertion of right ureteral stent at [facility].  Surgical request is placed with the surgery schedulers and will be scheduled at the patient's/family request. Informed consent is given as documented below. Anesthesia: General  The patient does not have sleep apnea, history of MRSA, history of VRE, history of cardiac device requiring special anesthetic needs. Patient is stable and considered clear for surgical in an outpatient ambulatory surgery setting as well as patient hospital setting.  Consent for Operation or Procedure: Provider Certification I hereby certify that the nature, purpose, benefits, usual and most frequent risks of, and alternatives to, the operation or procedure have been explained to the patient (or person authorized to sign for the patient) either by me as responsible physician or by the provider who is to perform the operation or procedure. Time spent such that  the patient/family has had an opportunity to ask questions, and that those questions have been answered. The patient or the patient's representative has been advised that selected tasks may be performed by assistants to the primary health care provider(s). I believe that the patient (or person authorized to sign for the patient) understands what has been explained, and has consented to the operation or procedure. No guarantees were implied or made.   Chief Complaint: Chief Complaint  Patient presents with   ureteral calculus    HPI: Johnny Johns is a 71 y.o. male who presents for continued evaluation of a right ureteral calculus.  He underwent cystoscopy with right ureteroscopy and right ureteral stent placement on 02/06/2021.  He developed urinary retention postoperatively requiring placement of a Foley catheter in the emergency department on 02/07/2021.  He had approximately 700 mL in his bladder.  He was started on tamsulosin on 02/06/2021.  No problems voiding prior to his procedure.  He subsequently underwent right ESL on 02/10/2021.   His Foley was removed on 02/12/2021 after a successful voiding trial.  KUB from 02/19/21 showed a right ureteral stent in good position and evidence of fragmentation of the proximal right ureteral calculus. His stent was not removed on 02/19/2021 due to concern for possible UTI.  Urine culture showed no growth. He was treated with Cipro x5 days. His right ureteral stent was removed on 03/02/2021. KUB from 03/13/2021 showed no obvious calcification along the expected course of the right ureter. He passed a number of small stone fragments.  He continued on tamsulosin with good control of his lower urinary tract symptoms. IPSS = 5. Stone analysis: 80% calcium oxalate monohydrate, 20% calcium oxalate dihydrate.  Renal ultrasound from 04/06/2021 showed mild right hydronephrosis. At his visit in 4/23, he was doing well.  No flank pain.  He was not aware of passing any  additional stone fragments.  No dysuria or gross hematuria.  He continued on tamsulosin with good control of his urinary symptoms. IPSS = 8. BMP showed Cr of 1.20. CT chest, abdomen, and pelvis from 05/15/2021 showed a 6 mm calculus in the right distal ureter without evidence of hydronephrosis.  He elected to attempt spontaneous passage at that time.  He continued on tamsulosin. KUB from 07/16/2021 showed a persistent calcification in the area of the right distal ureter. He underwent right ESL on 07/28/2021. He has not passed any fragments since the lithotripsy.  No flank pain.  No dysuria or gross hematuria.  He continues on tamsulosin and reports good control of his urinary symptoms. KUB from 08/13/2021 showed a persistent 6 mm calcification in the area of the right distal ureter unchanged from prior studies.  He returns today for follow-up.  He has not passed a stone.  He is not having any flank pain.  No dysuria or gross hematuria.   Portions of the above documentation were copied from a prior visit for review purposes only.  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   Presented with acute myocardial infarction In 01/2003; isolated 90% proximal LAD lesion treated with DES; EF 45-50%   Degenerative joint disease    Chronic neck and back pain   History of kidney stones    Hyperlipidemia    lipid profile in 06/2010:195, 207, 37, 117   Hypertension    IBS (irritable bowel syndrome)    Myocardial infarction ([facility])    PUD (peptic ulcer disease)    hx   Tobacco abuse, in remission     PSH: Past Surgical History:  Procedure Laterality Date   CARDIAC  CATHETERIZATION     stent-2005   CERVICAL FUSION  04/2021   CHOLECYSTECTOMY N/A 05/18/2021   Procedure: LAPAROSCOPIC CHOLECYSTECTOMY;  Surgeon: Jenkins, Mark, MD;  Location: [facility];  Service: General;  Laterality: N/A;   COLONOSCOPY  01/11/2006   CYSTOSCOPY W/ URETERAL STENT PLACEMENT Right 02/06/2021   Procedure: CYSTOSCOPY WITH RIGHT RETROGRADE PYELOGRAM/ RIGHT URETERAL STENT PLACEMENT/ RIGHT URETEROSCOPY;  Surgeon: Jahniyah Revere J., MD;  Location: [facility];  Service: Urology;  Laterality: Right;   EXTRACORPOREAL SHOCK WAVE LITHOTRIPSY Right 02/10/2021   Procedure: EXTRACORPOREAL SHOCK WAVE LITHOTRIPSY (ESWL);  Surgeon: Tamasha Laplante J., MD;  Location: [facility];  Service: Urology;  Laterality: Right;   EXTRACORPOREAL SHOCK WAVE LITHOTRIPSY Right 07/28/2021   Procedure: EXTRACORPOREAL SHOCK WAVE LITHOTRIPSY (ESWL);  Surgeon: Lequan Dobratz J., MD;  Location: [facility];  Service: Urology;  Laterality: Right;   KIDNEY STONE SURGERY  01/12/1988    SH: Social History   Tobacco Use   Smoking status: Former    Packs/day: 1.50    Years: 20.00    Total pack years: 30.00    Types: Cigarettes    Quit date: 01/18/2003    Years since quitting: 18.6   Smokeless tobacco: Never  Vaping Use   Vaping Use: Never used  Substance Use Topics   Alcohol use: No   Drug use: No      ROS: Constitutional:  Negative for fever, chills, weight loss CV: Negative for chest pain, previous MI, hypertension Respiratory:  Negative for shortness of breath, wheezing, sleep apnea, frequent cough GI:  Negative for nausea, vomiting, bloody stool, GERD  PE: BP 119/75   Pulse 73  GENERAL APPEARANCE:  Well appearing, well developed, well nourished, NAD HEENT:  Atraumatic, normocephalic, oropharynx clear NECK:  Supple without lymphadenopathy or thyromegaly ABDOMEN:  Soft, non-tender, no masses EXTREMITIES:  Moves all extremities well, without clubbing, cyanosis, or edema NEUROLOGIC:  Alert and oriented x 3,  normal gait, CN II-XII grossly intact MENTAL STATUS:  appropriate BACK:  Non-tender to palpation, No CVAT SKIN:  Warm, dry, and intact   Results: U/A: 0-5 WBCs, 0-2 RBCs, no bacteria

## 2021-08-29 LAB — BASIC METABOLIC PANEL
BUN/Creatinine Ratio: 15 (ref 10–24)
BUN: 19 mg/dL (ref 8–27)
CO2: 21 mmol/L (ref 20–29)
Calcium: 9 mg/dL (ref 8.6–10.2)
Chloride: 106 mmol/L (ref 96–106)
Creatinine, Ser: 1.24 mg/dL (ref 0.76–1.27)
Glucose: 89 mg/dL (ref 70–99)
Potassium: 4.9 mmol/L (ref 3.5–5.2)
Sodium: 141 mmol/L (ref 134–144)
eGFR: 61 mL/min/{1.73_m2} (ref >59)

## 2021-09-01 DIAGNOSIS — Z4789 Encounter for other orthopedic aftercare: Secondary | ICD-10-CM | POA: Diagnosis not present

## 2021-09-01 DIAGNOSIS — M542 Cervicalgia: Secondary | ICD-10-CM | POA: Diagnosis not present

## 2021-09-03 DIAGNOSIS — Z4789 Encounter for other orthopedic aftercare: Secondary | ICD-10-CM | POA: Diagnosis not present

## 2021-09-03 DIAGNOSIS — M542 Cervicalgia: Secondary | ICD-10-CM | POA: Diagnosis not present

## 2021-09-07 DIAGNOSIS — Z4789 Encounter for other orthopedic aftercare: Secondary | ICD-10-CM | POA: Diagnosis not present

## 2021-09-07 DIAGNOSIS — M542 Cervicalgia: Secondary | ICD-10-CM | POA: Diagnosis not present

## 2021-09-09 DIAGNOSIS — Z4789 Encounter for other orthopedic aftercare: Secondary | ICD-10-CM | POA: Diagnosis not present

## 2021-09-09 DIAGNOSIS — M542 Cervicalgia: Secondary | ICD-10-CM | POA: Diagnosis not present

## 2021-09-10 ENCOUNTER — Encounter (HOSPITAL_COMMUNITY): Payer: Self-pay

## 2021-09-10 ENCOUNTER — Encounter (HOSPITAL_COMMUNITY)
Admission: RE | Admit: 2021-09-10 | Discharge: 2021-09-10 | Disposition: A | Discharge status: Home / Self Care | Payer: Medicare PPO | Source: Ambulatory Visit | Attending: Urology | Admitting: Urology

## 2021-09-10 ENCOUNTER — Other Ambulatory Visit: Payer: Self-pay

## 2021-09-16 ENCOUNTER — Encounter (HOSPITAL_COMMUNITY): Payer: Self-pay | Admitting: Urology

## 2021-09-16 ENCOUNTER — Ambulatory Visit (HOSPITAL_COMMUNITY): Payer: Medicare PPO | Admitting: Anesthesiology

## 2021-09-16 ENCOUNTER — Ambulatory Visit (HOSPITAL_COMMUNITY): Payer: Medicare PPO

## 2021-09-16 ENCOUNTER — Ambulatory Visit (HOSPITAL_COMMUNITY)
Admission: RE | Admit: 2021-09-16 | Discharge: 2021-09-16 | Disposition: A | Discharge status: Home / Self Care | Payer: Medicare PPO | Attending: Urology | Admitting: Urology

## 2021-09-16 ENCOUNTER — Ambulatory Visit (HOSPITAL_BASED_OUTPATIENT_CLINIC_OR_DEPARTMENT_OTHER): Payer: Medicare PPO | Admitting: Anesthesiology

## 2021-09-16 ENCOUNTER — Encounter (HOSPITAL_COMMUNITY)
Admission: RE | Disposition: A | Discharge status: Home / Self Care | Payer: Self-pay | Source: Home / Self Care | Attending: Urology

## 2021-09-16 DIAGNOSIS — I252 Old myocardial infarction: Secondary | ICD-10-CM

## 2021-09-16 DIAGNOSIS — Z955 Presence of coronary angioplasty implant and graft: Secondary | ICD-10-CM | POA: Insufficient documentation

## 2021-09-16 DIAGNOSIS — N202 Calculus of kidney with calculus of ureter: Secondary | ICD-10-CM | POA: Diagnosis not present

## 2021-09-16 DIAGNOSIS — I10 Essential (primary) hypertension: Secondary | ICD-10-CM | POA: Diagnosis not present

## 2021-09-16 DIAGNOSIS — N201 Calculus of ureter: Secondary | ICD-10-CM | POA: Diagnosis not present

## 2021-09-16 DIAGNOSIS — Z8711 Personal history of peptic ulcer disease: Secondary | ICD-10-CM | POA: Insufficient documentation

## 2021-09-16 DIAGNOSIS — I878 Other specified disorders of veins: Secondary | ICD-10-CM | POA: Diagnosis not present

## 2021-09-16 DIAGNOSIS — Z87442 Personal history of urinary calculi: Secondary | ICD-10-CM | POA: Diagnosis not present

## 2021-09-16 DIAGNOSIS — I2510 Atherosclerotic heart disease of native coronary artery without angina pectoris: Secondary | ICD-10-CM | POA: Diagnosis not present

## 2021-09-16 DIAGNOSIS — Z87891 Personal history of nicotine dependence: Secondary | ICD-10-CM

## 2021-09-16 DIAGNOSIS — Z9049 Acquired absence of other specified parts of digestive tract: Secondary | ICD-10-CM | POA: Diagnosis not present

## 2021-09-16 HISTORY — PX: STONE EXTRACTION WITH BASKET: SHX5318

## 2021-09-16 HISTORY — PX: HOLMIUM LASER APPLICATION: SHX5852

## 2021-09-16 HISTORY — PX: CYSTOSCOPY WITH RETROGRADE PYELOGRAM, URETEROSCOPY AND STENT PLACEMENT: SHX5789

## 2021-09-16 SURGERY — CYSTOURETEROSCOPY, WITH RETROGRADE PYELOGRAM AND STENT INSERTION
Anesthesia: General | Site: Ureter | Laterality: Right

## 2021-09-16 MED ORDER — PHENAZOPYRIDINE HCL 200 MG PO TABS: 200.0000 mg | ORAL_TABLET | Freq: Three times a day (TID) | ORAL | 0 refills | Status: DC | PRN

## 2021-09-16 MED ORDER — CIPROFLOXACIN HCL 500 MG PO TABS: 500.0000 mg | ORAL_TABLET | Freq: Two times a day (BID) | ORAL | 0 refills | Status: AC

## 2021-09-16 MED ORDER — HYDROCODONE-ACETAMINOPHEN 5-325 MG PO TABS: 1.0000 | ORAL_TABLET | Freq: Four times a day (QID) | ORAL | 0 refills | Status: DC | PRN

## 2021-09-16 MED ORDER — CHLORHEXIDINE GLUCONATE 0.12 % MT SOLN
15.0000 mL | Freq: Once | OROMUCOSAL | Status: AC
  Administered 2021-09-16: 15 mL via OROMUCOSAL

## 2021-09-16 MED ORDER — FENTANYL CITRATE (PF) 100 MCG/2ML IJ SOLN
INTRAMUSCULAR | Status: DC | PRN
  Administered 2021-09-16: 50 ug via INTRAVENOUS

## 2021-09-16 MED ORDER — CEFAZOLIN SODIUM-DEXTROSE 2-4 GM/100ML-% IV SOLN
2.0000 g | INTRAVENOUS | Status: AC
  Administered 2021-09-16: 2 g via INTRAVENOUS
  Filled 2021-09-16: qty 100

## 2021-09-16 MED ORDER — LIDOCAINE HCL (PF) 2 % IJ SOLN
INTRAMUSCULAR | Status: AC
  Filled 2021-09-16: qty 5

## 2021-09-16 MED ORDER — SODIUM CHLORIDE 0.9 % IR SOLN
Status: DC | PRN
  Administered 2021-09-16: 3000 mL

## 2021-09-16 MED ORDER — ORAL CARE MOUTH RINSE
15.0000 mL | Freq: Once | OROMUCOSAL | Status: AC
Start: 2021-09-16 — End: 2021-09-16

## 2021-09-16 MED ORDER — ONDANSETRON HCL 4 MG/2ML IJ SOLN
INTRAMUSCULAR | Status: AC
  Filled 2021-09-16: qty 2

## 2021-09-16 MED ORDER — MEPERIDINE HCL 50 MG/ML IJ SOLN: 6.2500 mg | INTRAMUSCULAR | Status: DC | PRN

## 2021-09-16 MED ORDER — LIDOCAINE HCL URETHRAL/MUCOSAL 2 % EX GEL
CUTANEOUS | Status: DC | PRN
  Administered 2021-09-16: 1 via URETHRAL

## 2021-09-16 MED ORDER — LIDOCAINE 2% (20 MG/ML) 5 ML SYRINGE
INTRAMUSCULAR | Status: DC | PRN
  Administered 2021-09-16: 100 mg via INTRAVENOUS

## 2021-09-16 MED ORDER — DIATRIZOATE MEGLUMINE 30 % UR SOLN
URETHRAL | Status: AC
  Filled 2021-09-16: qty 100

## 2021-09-16 MED ORDER — DEXAMETHASONE SODIUM PHOSPHATE 10 MG/ML IJ SOLN
INTRAMUSCULAR | Status: AC
  Filled 2021-09-16: qty 1

## 2021-09-16 MED ORDER — DEXAMETHASONE SODIUM PHOSPHATE 10 MG/ML IJ SOLN
INTRAMUSCULAR | Status: DC | PRN
  Administered 2021-09-16: 5 mg via INTRAVENOUS

## 2021-09-16 MED ORDER — HYDROMORPHONE HCL 1 MG/ML IJ SOLN: 0.2500 mg | INTRAMUSCULAR | Status: DC | PRN

## 2021-09-16 MED ORDER — ROCURONIUM BROMIDE 10 MG/ML (PF) SYRINGE
PREFILLED_SYRINGE | INTRAVENOUS | Status: AC
  Filled 2021-09-16: qty 10

## 2021-09-16 MED ORDER — PHENYLEPHRINE 80 MCG/ML (10ML) SYRINGE FOR IV PUSH (FOR BLOOD PRESSURE SUPPORT)
PREFILLED_SYRINGE | INTRAVENOUS | Status: DC | PRN
  Administered 2021-09-16 (×3): 160 ug via INTRAVENOUS
  Administered 2021-09-16: 80 ug via INTRAVENOUS
  Administered 2021-09-16: 160 ug via INTRAVENOUS
  Administered 2021-09-16: 80 ug via INTRAVENOUS

## 2021-09-16 MED ORDER — FENTANYL CITRATE (PF) 250 MCG/5ML IJ SOLN
INTRAMUSCULAR | Status: AC
  Filled 2021-09-16: qty 5

## 2021-09-16 MED ORDER — ONDANSETRON HCL 4 MG/2ML IJ SOLN
INTRAMUSCULAR | Status: DC | PRN
  Administered 2021-09-16: 4 mg via INTRAVENOUS

## 2021-09-16 MED ORDER — LACTATED RINGERS IV SOLN
INTRAVENOUS | Status: DC
  Administered 2021-09-16: 1000 mL via INTRAVENOUS

## 2021-09-16 MED ORDER — DIATRIZOATE MEGLUMINE 30 % UR SOLN
URETHRAL | Status: DC | PRN
  Administered 2021-09-16: 6 mL via URETHRAL

## 2021-09-16 MED ORDER — PROPOFOL 10 MG/ML IV BOLUS
INTRAVENOUS | Status: DC | PRN
  Administered 2021-09-16: 200 mg via INTRAVENOUS

## 2021-09-16 MED ORDER — PROPOFOL 10 MG/ML IV BOLUS
INTRAVENOUS | Status: AC
  Filled 2021-09-16: qty 20

## 2021-09-16 MED ORDER — KETOROLAC TROMETHAMINE 30 MG/ML IJ SOLN
INTRAMUSCULAR | Status: AC
  Filled 2021-09-16: qty 1

## 2021-09-16 MED ORDER — WATER FOR IRRIGATION, STERILE IR SOLN
Status: DC | PRN
  Administered 2021-09-16: 500 mL

## 2021-09-16 MED ORDER — ONDANSETRON HCL 4 MG/2ML IJ SOLN: 4.0000 mg | Freq: Once | INTRAMUSCULAR | Status: DC | PRN

## 2021-09-16 MED ORDER — LIDOCAINE HCL URETHRAL/MUCOSAL 2 % EX GEL
CUTANEOUS | Status: AC
  Filled 2021-09-16: qty 10

## 2021-09-16 MED ORDER — KETOROLAC TROMETHAMINE 15 MG/ML IJ SOLN
INTRAMUSCULAR | Status: DC | PRN
  Administered 2021-09-16: 15 mg via INTRAVENOUS

## 2021-09-16 SURGICAL SUPPLY — 23 items
BAG DRAIN URO TABLE W/ADPT NS (BAG) ×2 IMPLANT
BAG DRN 8 ADPR NS SKTRN CSTL (BAG) ×2
BAG HAMPER (MISCELLANEOUS) ×2 IMPLANT
CATH URET 5FR 28IN OPEN ENDED (CATHETERS) ×2 IMPLANT
CLOTH BEACON ORANGE TIMEOUT ST (SAFETY) ×2 IMPLANT
COVER MAYO STAND XLG (MISCELLANEOUS) ×2 IMPLANT
EXTRACTOR STONE NITINOL NGAGE (UROLOGICAL SUPPLIES) IMPLANT
GLOVE BIO SURGEON STRL SZ8 (GLOVE) ×2 IMPLANT
GLOVE BIOGEL PI IND STRL 7.0 (GLOVE) ×4 IMPLANT
GOWN STRL REUS W/TWL LRG LVL3 (GOWN DISPOSABLE) ×2 IMPLANT
GOWN STRL REUS W/TWL XL LVL3 (GOWN DISPOSABLE) ×2 IMPLANT
GUIDEWIRE STR DUAL SENSOR (WIRE) ×2 IMPLANT
IV NS IRRIG 3000ML ARTHROMATIC (IV SOLUTION) ×4 IMPLANT
KIT TURNOVER CYSTO (KITS) ×2 IMPLANT
MANIFOLD NEPTUNE II (INSTRUMENTS) ×2 IMPLANT
PACK CYSTO (CUSTOM PROCEDURE TRAY) ×2 IMPLANT
PAD ARMBOARD 7.5X6 YLW CONV (MISCELLANEOUS) ×2 IMPLANT
STENT URET 6FRX26 CONTOUR (STENTS) IMPLANT
SYR 10ML LL (SYRINGE) ×2 IMPLANT
TOWEL OR 17X26 4PK STRL BLUE (TOWEL DISPOSABLE) ×2 IMPLANT
TRACTIP FLEXIVA PULS ID 200XHI (Laser) IMPLANT
TRACTIP FLEXIVA PULSE ID 200 (Laser) ×2
WATER STERILE IRR 500ML POUR (IV SOLUTION) ×2 IMPLANT

## 2021-09-16 NOTE — Anesthesia Procedure Notes (Signed)
Procedure Name: LMA Insertion Date/Time: 09/16/2021 9:42 AM  Performed by: Marny Lowenstein, CRNAPre-anesthesia Checklist: Patient identified, Emergency Drugs available, Suction available and Patient being monitored Patient Re-evaluated:Patient Re-evaluated prior to induction Oxygen Delivery Method: Circle system utilized Preoxygenation: Pre-oxygenation with 100% oxygen Induction Type: IV induction Ventilation: Mask ventilation without difficulty LMA: LMA inserted LMA Size: 4.0 Number of attempts: 2 Placement Confirmation: positive ETCO2 and breath sounds checked- equal and bilateral Tube secured with: Tape Dental Injury: Teeth and Oropharynx as per pre-operative assessment  Comments: Attempted to insert #5 LMA. Unable to gently pass beyond posterior pharynx. Easily inserted #4 LMA.

## 2021-09-16 NOTE — Op Note (Signed)
OPERATIVE NOTE   Patient Name: Johnny Johns  MRN: [id number]   Date of Procedure: 09/16/21  Preoperative diagnosis:  Right ureteral calculus  Postoperative diagnosis:  Right ureteral calculus  Procedure:  Cystoscopy Right retrograde pyelogram with intraoperative interpretation Right ureteroscopic laser lithotripsy with stone manipulation (obtained) Insertion of right ureteral stent (54F x 26 cm, with tether)  Attending: Milderd Meager, MD  Anesthesia: General with LMA  Estimated blood loss: None  Fluids: Per anesthesia record  Drains: 54F x 26 cm right ureteral stent with tether  Specimens: Stone fragments  Antibiotics: Ancef 2 g IV  Findings: Normal bladder; 5 x 10 mm right distal ureteral calculus with minimal ureteral dilation proximally  Indications:  71 year old male presents for surgical management of a right distal ureteral calculus.  He initially underwent cystoscopy with right ureteroscopy and right ureteral stent placement in January 2023.  He subsequently underwent right ESL on 02/10/2021.  The ureteral stent was removed approximately 3 weeks later.  Follow-up KUB showed no obvious calcification along the expected course of the right ureter.  He passed a number of small stone fragments.  Stone analysis showed 80% calcium oxalate monohydrate and 20% calcium oxalate dihydrate.  CT of the abdomen and pelvis from May 15, 2021 showed a 6 mm calculus in the right distal ureter without evidence of hydronephrosis.  He attempted spontaneous passage initially.  He underwent right ESL for the distal ureteral calculus on 07/28/2021.  He was unable to pass any fragments.  Serial KUB imaging showed persistent 6 mm calcification in the area of the right distal ureter which remained unchanged.  He was not having any flank pain or other urinary symptoms.  Treatment options were discussed with the patient.  He elected to proceed with cystoscopy, right retrograde pyelogram, right  ureteroscopic laser lithotripsy and stone manipulation with insertion of right ureteral stent.  KUB obtained this morning shows persistence of the 5 x 10 mm calcification in the right pelvis consistent with the known right distal ureteral calculus. Risks, benefits, alternatives were discussed with the patient in detail.  Potential risks including, but not limited to, infection; bleeding;  injury to urethra, bladder, or ureter; possible need of other treatments; possible failure to remove the calculus; ureteral  stricture formation; cardiac, pulmonary, cerebrovascular events; and anesthetic complications were discussed.  The patient understands and wishes to proceed.   Description of Procedure:  The patient received IV Ancef preoperatively.  After successful induction of a general anesthetic, the patient was placed in the lithotomy position.  The patient's genitalia was prepped and draped in sterile fashion.  Under direct visualization, a 22 French rigid cystoscope was passed through the urethra and into the bladder.  Patient was noted to have lateral lobe enlargement of the prostate.  No bladder abnormalities were appreciated.  No stones are seen within the bladder.  A right retrograde pyelogram was performed for evaluation of the patient's right ureter and collecting system given the known right ureteral calculus.  Scout film showed a nonspecific bowel gas pattern and no obvious bony abnormalities.  A calcification measuring 5 x 10 mm in size was seen in the right pelvis.  Using a 5 [place] open-ended catheter, contrast was injected into the right ureter.  The calcification was confirmed to be within the distal ureter.  There was minimal dilation of the ureter proximally.  The upper collecting system was otherwise normal in appearance.  A sensor guidewire was passed into the right ureter under cystoscopic and fluoroscopic  guidance.  A semirigid ureteroscope was then passed into the distal ureter along the  guidewire.  The stone was visualized in the distal ureter.  Using the holmium laser fiber, the stone was fragmented into multiple small pieces.  Using a stone basket, all fragments were removed without difficulty.  Ureteroscopy was again performed and no remaining stone fragments were appreciated.  There was no evidence of any ureteral injury.  A 6 French by 26 cm double-J stent was passed over the guidewire and into the right kidney under fluoroscopic guidance.  A good stent curl was noted both proximally and distally.  The tether was left attached and brought through the meatus.  The bladder was then drained with removal of stone fragments.  The cystoscope was removed.  Intraurethral lidocaine jelly was placed.  The patient was then extubated taken to the post anesthesia care unit in stable condition.  Complications: None  Condition: Stable, extubated, transferred to PACU  Plan:  Discharge to home after voiding Continue stent for 5-7 days.

## 2021-09-16 NOTE — Anesthesia Preprocedure Evaluation (Signed)
Anesthesia Evaluation  Patient identified by MRN, date of birth, ID band Patient awake    Reviewed: Allergy & Precautions, NPO status , Patient's Chart, lab work & pertinent test results  Airway Mallampati: III  TM Distance: >3 FB Neck ROM: Full    Dental  (+) Dental Advisory Given, Chipped,    Pulmonary former smoker,    Pulmonary exam normal breath sounds clear to auscultation       Cardiovascular hypertension, Pt. on medications + Past MI and + Cardiac Stents  Normal cardiovascular exam Rhythm:Regular Rate:Normal     Neuro/Psych negative neurological ROS  negative psych ROS   GI/Hepatic Neg liver ROS, PUD,   Endo/Other  negative endocrine ROS  Renal/GU Renal disease  negative genitourinary   Musculoskeletal  (+) Arthritis , Osteoarthritis,    Abdominal   Peds negative pediatric ROS (+)  Hematology negative hematology ROS (+)   Anesthesia Other Findings Chronic neck and back pain  Reproductive/Obstetrics negative OB ROS                             Anesthesia Physical Anesthesia Plan  ASA: 3  Anesthesia Plan: General   Post-op Pain Management: Dilaudid IV   Induction: Intravenous  PONV Risk Score and Plan: Ondansetron and Dexamethasone  Airway Management Planned: LMA  Additional Equipment:   Intra-op Plan:   Post-operative Plan: Extubation in OR  Informed Consent: I have reviewed the patients History and Physical, chart, labs and discussed the procedure including the risks, benefits and alternatives for the proposed anesthesia with the patient or authorized representative who has indicated his/her understanding and acceptance.     Dental advisory given  Plan Discussed with: CRNA and Surgeon  Anesthesia Plan Comments:         Anesthesia Quick Evaluation

## 2021-09-16 NOTE — Anesthesia Postprocedure Evaluation (Signed)
Anesthesia Post Note  Patient: Johnny Johns  Procedure(s) Performed: CYSTOSCOPY WITH RETROGRADE PYELOGRAM, URETEROSCOPY AND STENT PLACEMENT (Right: Renal) HOLMIUM LASER APPLICATION (Right: Renal) STONE EXTRACTION WITH BASKET (Right: Ureter)  Patient location during evaluation: Phase II Anesthesia Type: General Level of consciousness: awake and alert and oriented Pain management: pain level controlled Vital Signs Assessment: post-procedure vital signs reviewed and stable Respiratory status: spontaneous breathing, nonlabored ventilation and respiratory function stable Cardiovascular status: blood pressure returned to baseline and stable Postop Assessment: no apparent nausea or vomiting Anesthetic complications: no   No notable events documented.   Last Vitals:  Vitals:   09/16/21 1045 09/16/21 1053  BP: (!) 134/92 (!) 134/92  Pulse: 85 79  Resp: 16 16  Temp:  36.7 C  SpO2: 96%     Last Pain:  Vitals:   09/16/21 1053  TempSrc: Oral  PainSc: 0-No pain                 Lashayla Armes C Erinn Huskins

## 2021-09-16 NOTE — Interval H&P Note (Signed)
History and Physical Interval Note:  09/16/2021 8:58 AM  Johnny Johns  has presented today for surgery, with the diagnosis of right ureteral calculus.  The various methods of treatment have been discussed with the patient and family. After consideration of risks, benefits and other options for treatment, the patient has consented to  Procedure(s) with comments: CYSTOSCOPY WITH RETROGRADE PYELOGRAM, URETEROSCOPY AND STENT PLACEMENT (Right) - pt knows to arrive at 8:00 HOLMIUM LASER APPLICATION (Right) as a surgical intervention.  The patient's history has been reviewed, patient examined, no change in status, stable for surgery.  I have reviewed the patient's chart and labs.  Questions were answered to the patient's satisfaction.     Di Kindle

## 2021-09-16 NOTE — Transfer of Care (Signed)
Immediate Anesthesia Transfer of Care Note  Patient: FREDIE MAJANO  Procedure(s) Performed: CYSTOSCOPY WITH RETROGRADE PYELOGRAM, URETEROSCOPY AND STENT PLACEMENT (Right: Renal) HOLMIUM LASER APPLICATION (Right: Renal) STONE EXTRACTION WITH BASKET (Right: Ureter)  Patient Location: PACU  Anesthesia Type:General  Level of Consciousness: awake, alert  and oriented  Airway & Oxygen Therapy: Patient Spontanous Breathing  Post-op Assessment: Report given to RN and Post -op Vital signs reviewed and stable  Post vital signs: Reviewed and stable  Last Vitals:  Vitals Value Taken Time  BP 113/84 09/16/21 1030  Temp    Pulse 88 09/16/21 1028  Resp 16 09/16/21 1031  SpO2 97 % 09/16/21 1028  Vitals shown include unvalidated device data.  Last Pain:  Vitals:   09/16/21 0832  TempSrc: Oral  PainSc: 0-No pain      Patients Stated Pain Goal: 5 (09/16/21 9562)  Complications: No notable events documented.

## 2021-09-22 ENCOUNTER — Encounter (HOSPITAL_COMMUNITY): Payer: Self-pay | Admitting: Urology

## 2021-09-22 DIAGNOSIS — N200 Calculus of kidney: Secondary | ICD-10-CM | POA: Diagnosis not present

## 2021-09-22 DIAGNOSIS — Z23 Encounter for immunization: Secondary | ICD-10-CM | POA: Diagnosis not present

## 2021-09-22 DIAGNOSIS — I10 Essential (primary) hypertension: Secondary | ICD-10-CM | POA: Diagnosis not present

## 2021-09-23 ENCOUNTER — Encounter: Payer: Self-pay | Admitting: Urology

## 2021-09-23 ENCOUNTER — Ambulatory Visit: Payer: Medicare PPO | Admitting: Urology

## 2021-09-23 VITALS — BP 110/76 | HR 87

## 2021-09-23 DIAGNOSIS — N201 Calculus of ureter: Secondary | ICD-10-CM

## 2021-09-23 DIAGNOSIS — Z87442 Personal history of urinary calculi: Secondary | ICD-10-CM

## 2021-09-23 DIAGNOSIS — N401 Enlarged prostate with lower urinary tract symptoms: Secondary | ICD-10-CM

## 2021-09-23 DIAGNOSIS — N138 Other obstructive and reflux uropathy: Secondary | ICD-10-CM

## 2021-09-23 LAB — STONE ANALYSIS
Calcium Oxalate Dihydrate: 10 %
Calcium Oxalate Monohydrate: 90 %
Weight Calculi: 63 mg

## 2021-09-23 MED ORDER — CIPROFLOXACIN HCL 500 MG PO TABS: 500.0000 mg | ORAL_TABLET | Freq: Once | ORAL | Status: DC

## 2021-09-23 NOTE — Progress Notes (Signed)
Assessment: 1. Ureteral calculus, right   2. BPH with obstruction/lower urinary tract symptoms     Plan: Stent removed with tether today. Cipro x1 following stent removal. Continue tamsulosin. Stone prevention discussed Return to office in 1 month  Chief Complaint: Chief Complaint  Patient presents with   Routine Post Op    HPI: Johnny Johns is a 71 y.o. male who presents for continued evaluation of a right ureteral calculus.  He underwent cystoscopy with right ureteroscopy and right ureteral stent placement on 02/06/2021.  He developed urinary retention postoperatively requiring placement of a Foley catheter in the emergency department on 02/07/2021.  He had approximately 700 mL in his bladder.  He was started on tamsulosin on 02/06/2021.  No problems voiding prior to his procedure.  He subsequently underwent right ESL on 02/10/2021.   His Foley was removed on 02/12/2021 after a successful voiding trial.  KUB from 02/19/21 showed a right ureteral stent in good position and evidence of fragmentation of the proximal right ureteral calculus. His stent was not removed on 02/19/2021 due to concern for possible UTI.  Urine culture showed no growth. He was treated with Cipro x5 days. His right ureteral stent was removed on 03/02/2021. KUB from 03/13/2021 showed no obvious calcification along the expected course of the right ureter. He passed a number of small stone fragments.  He continued on tamsulosin with good control of his lower urinary tract symptoms. IPSS = 5. Stone analysis: 80% calcium oxalate monohydrate, 20% calcium oxalate dihydrate.  Renal ultrasound from 04/06/2021 showed mild right hydronephrosis. At his visit in 4/23, he was doing well. No flank pain.  He was not aware of passing any additional stone fragments.  No dysuria or gross hematuria.  He continued on tamsulosin with good control of his urinary symptoms. IPSS = 8. BMP showed Cr of 1.20. CT chest, abdomen, and pelvis  from 05/15/2021 showed a 6 mm calculus in the right distal ureter without evidence of hydronephrosis.  He elected to attempt spontaneous passage at that time.  He continued on tamsulosin. KUB from 07/16/2021 showed a persistent calcification in the area of the right distal ureter. He underwent right ESL on 07/28/2021. He has not passed any fragments since the lithotripsy.  No flank pain.  No dysuria or gross hematuria.  He continues on tamsulosin and reports good control of his urinary symptoms. KUB from 08/13/2021 showed a persistent 6 mm calcification in the area of the right distal ureter unchanged from prior studies. KUB from 09/16/2021 showed a persistent calcification in the area of the right distal ureter. He underwent right ureteroscopic laser lithotripsy with stone manipulation and stent insertion on 09/16/2021.  The stone was fragmented and removed.  He has a right ureteral stent in place with a tether.  He presents today for follow-up.  He is doing well.  No significant stent related symptoms.  No problems voiding.  He completed his antibiotics last night. Stone analysis pending.  Portions of the above documentation were copied from a prior visit for review purposes only.  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   Presented with  acute myocardial infarction In 01/2003; isolated 90% proximal LAD lesion treated with DES; EF 45-50%   Degenerative joint disease    Chronic neck and back pain   History of kidney stones    Hyperlipidemia    lipid profile in 06/2010:195, 207, 37, 117   Hypertension    IBS (irritable bowel syndrome)    Myocardial infarction  (HCC)    PUD (peptic ulcer disease)    hx   Tobacco abuse, in remission     PSH: Past Surgical History:  Procedure Laterality Date   CARDIAC CATHETERIZATION     stent-2005   CERVICAL FUSION  04/2021   CHOLECYSTECTOMY N/A 05/18/2021   Procedure: LAPAROSCOPIC CHOLECYSTECTOMY;  Surgeon: [name], MD;  Location: [facility];  Service: General;  Laterality: N/A;   COLONOSCOPY  01/11/2006   CYSTOSCOPY W/ URETERAL STENT PLACEMENT Right 02/06/2021   Procedure: CYSTOSCOPY WITH RIGHT RETROGRADE PYELOGRAM/ RIGHT URETERAL STENT PLACEMENT/ RIGHT URETEROSCOPY;  Surgeon: Milderd Meager., MD;  Location: [facility];  Service: Urology;  Laterality: Right;   CYSTOSCOPY WITH RETROGRADE PYELOGRAM, URETEROSCOPY AND STENT PLACEMENT Right 09/16/2021   Procedure: CYSTOSCOPY WITH RETROGRADE PYELOGRAM, URETEROSCOPY AND STENT PLACEMENT;  Surgeon: Milderd Meager., MD;  Location: [facility];  Service: Urology;  Laterality: Right;   EXTRACORPOREAL SHOCK WAVE LITHOTRIPSY Right 02/10/2021   Procedure: EXTRACORPOREAL SHOCK WAVE LITHOTRIPSY (ESWL);  Surgeon: Milderd Meager., MD;  Location: [facility];  Service: Urology;  Laterality: Right;   EXTRACORPOREAL SHOCK WAVE LITHOTRIPSY Right 07/28/2021   Procedure: EXTRACORPOREAL SHOCK WAVE LITHOTRIPSY (ESWL);  Surgeon: Milderd Meager., MD;  Location: [facility];  Service: Urology;  Laterality: Right;   HOLMIUM LASER APPLICATION Right 09/16/2021   Procedure: HOLMIUM LASER APPLICATION;  Surgeon: Milderd Meager., MD;  Location: [facility];  Service: Urology;  Laterality: Right;   KIDNEY STONE SURGERY  01/12/1988   STONE EXTRACTION WITH BASKET Right 09/16/2021   Procedure: STONE EXTRACTION WITH BASKET;  Surgeon: Milderd Meager., MD;  Location: [facility];  Service: Urology;  Laterality: Right;    SH: Social History   Tobacco Use   Smoking status: Former    Packs/day: 1.50    Years: 20.00    Total pack years: 30.00    Types: Cigarettes    Quit date: 01/18/2003    Years  since quitting: 18.6   Smokeless tobacco: Never  Vaping Use   Vaping Use: Never used  Substance Use Topics   Alcohol use: No   Drug use: No    ROS: Constitutional:  Negative for fever, chills, weight loss CV: Negative for chest pain, previous MI, hypertension Respiratory:  Negative for shortness of breath, wheezing, sleep apnea, frequent cough GI:  Negative for nausea, vomiting, bloody stool, GERD  PE: BP 110/76   Pulse 87  GENERAL APPEARANCE:  Well appearing, well developed, well nourished, NAD HEENT:  Atraumatic, normocephalic, oropharynx clear NECK:  Supple without lymphadenopathy or thyromegaly ABDOMEN:  Soft, non-tender, no masses EXTREMITIES:  Moves all extremities well, without clubbing, cyanosis, or edema NEUROLOGIC:  Alert and oriented x 3, normal gait, CN II-XII grossly intact MENTAL STATUS:  appropriate BACK:  Non-tender to palpation, No CVAT SKIN:  Warm, dry, and intact   Results: None

## 2021-09-24 DIAGNOSIS — M542 Cervicalgia: Secondary | ICD-10-CM | POA: Diagnosis not present

## 2021-09-24 DIAGNOSIS — Z4789 Encounter for other orthopedic aftercare: Secondary | ICD-10-CM | POA: Diagnosis not present

## 2021-09-28 ENCOUNTER — Telehealth: Payer: Self-pay

## 2021-09-28 ENCOUNTER — Other Ambulatory Visit: Payer: Medicare PPO

## 2021-09-28 DIAGNOSIS — N390 Urinary tract infection, site not specified: Secondary | ICD-10-CM

## 2021-09-28 LAB — MICROSCOPIC EXAMINATION

## 2021-09-28 LAB — URINALYSIS, ROUTINE W REFLEX MICROSCOPIC
Bilirubin, UA: NEGATIVE
Glucose, UA: NEGATIVE
Nitrite, UA: NEGATIVE
Specific Gravity, UA: 1.025 (ref 1.005–1.030)
Urobilinogen, Ur: 0.2 mg/dL (ref 0.2–1.0)
pH, UA: 5 (ref 5.0–7.5)

## 2021-09-28 NOTE — Telephone Encounter (Signed)
Patient called and made aware urine specimen  will be sent for culture and someone will call with the results.

## 2021-09-28 NOTE — Telephone Encounter (Signed)
Returned call from [employer]. Patient states over the weekend he ran a fever on and off and had difficulty voiding  a couple of times. Patient request to come by office and drop off specimen. Reviewed symptoms with Dr. Felipa Eth and ok for patient to drop off urine at lab.

## 2021-09-29 DIAGNOSIS — Z4789 Encounter for other orthopedic aftercare: Secondary | ICD-10-CM | POA: Diagnosis not present

## 2021-09-29 DIAGNOSIS — M542 Cervicalgia: Secondary | ICD-10-CM | POA: Diagnosis not present

## 2021-09-30 ENCOUNTER — Encounter: Payer: Self-pay | Admitting: Urology

## 2021-09-30 LAB — URINE CULTURE

## 2021-09-30 LAB — SPECIMEN STATUS REPORT

## 2021-09-30 MED ORDER — SULFAMETHOXAZOLE-TRIMETHOPRIM 800-160 MG PO TABS: 1.0000 | ORAL_TABLET | Freq: Two times a day (BID) | ORAL | 0 refills | Status: DC

## 2021-09-30 NOTE — Addendum Note (Signed)
Addended by: Primus Bravo on: 09/30/2021 03:57 PM   Modules accepted: Orders

## 2021-10-01 ENCOUNTER — Telehealth: Payer: Self-pay

## 2021-10-01 NOTE — Telephone Encounter (Signed)
Patient is aware and has already started antibiotic.

## 2021-10-01 NOTE — Telephone Encounter (Signed)
-----   Message from Primus Bravo, MD sent at 09/30/2021  3:57 PM EDT ----- Please notify Johnny Johns that his urine culture shows evidence of a UTI. Rx for Bactrim DS x 7 days sent to his pharmacy.

## 2021-11-17 ENCOUNTER — Encounter: Payer: Self-pay | Admitting: Urology

## 2021-11-17 ENCOUNTER — Ambulatory Visit: Payer: Medicare PPO | Admitting: Urology

## 2021-11-17 VITALS — BP 135/75 | HR 81

## 2021-11-17 DIAGNOSIS — N201 Calculus of ureter: Secondary | ICD-10-CM | POA: Diagnosis not present

## 2021-11-17 DIAGNOSIS — N401 Enlarged prostate with lower urinary tract symptoms: Secondary | ICD-10-CM

## 2021-11-17 DIAGNOSIS — N138 Other obstructive and reflux uropathy: Secondary | ICD-10-CM

## 2021-11-17 DIAGNOSIS — N200 Calculus of kidney: Secondary | ICD-10-CM

## 2021-11-17 DIAGNOSIS — Z87442 Personal history of urinary calculi: Secondary | ICD-10-CM | POA: Diagnosis not present

## 2021-11-17 NOTE — Progress Notes (Signed)
Assessment: 1. Ureteral calculus, right   2. Kidney stones   3. BPH with obstruction/lower urinary tract symptoms     Plan: Continue tamsulosin. Continue stone prevention Renal U/S to evaluate for hydronephrosis following management of ureteral calculus PSA monitoring by Dr. Willey Blade Return to office prn  Chief Complaint: Chief Complaint  Patient presents with   Benign Prostatic Hypertrophy    HPI: Johnny Johns is a 71 y.o. male who presents for continued evaluation of a right ureteral calculus.  He underwent cystoscopy with right ureteroscopy and right ureteral stent placement on 02/06/2021.  He developed urinary retention postoperatively requiring placement of a Foley catheter in the emergency department on 02/07/2021.  He had approximately 700 mL in his bladder.  He was started on tamsulosin on 02/06/2021.  No problems voiding prior to his procedure.  He subsequently underwent right ESL on 02/10/2021.   His Foley was removed on 02/12/2021 after a successful voiding trial.  KUB from 02/19/21 showed a right ureteral stent in good position and evidence of fragmentation of the proximal right ureteral calculus. His stent was not removed on 02/19/2021 due to concern for possible UTI.  Urine culture showed no growth. He was treated with Cipro x5 days. His right ureteral stent was removed on 03/02/2021. KUB from 03/13/2021 showed no obvious calcification along the expected course of the right ureter. He passed a number of small stone fragments.  He continued on tamsulosin with good control of his lower urinary tract symptoms. IPSS = 5. Stone analysis: 80% calcium oxalate monohydrate, 20% calcium oxalate dihydrate.  Renal ultrasound from 04/06/2021 showed mild right hydronephrosis. At his visit in 4/23, he was doing well. No flank pain.  He was not aware of passing any additional stone fragments.  No dysuria or gross hematuria.  He continued on tamsulosin with good control of his urinary  symptoms. IPSS = 8. BMP showed Cr of 1.20. CT chest, abdomen, and pelvis from 05/15/2021 showed a 6 mm calculus in the right distal ureter without evidence of hydronephrosis.  He elected to attempt spontaneous passage at that time.  He continued on tamsulosin. KUB from 07/16/2021 showed a persistent calcification in the area of the right distal ureter. He underwent right ESL on 07/28/2021. He has not passed any fragments since the lithotripsy.  No flank pain.  No dysuria or gross hematuria.  He continues on tamsulosin and reports good control of his urinary symptoms. KUB from 08/13/2021 showed a persistent 6 mm calcification in the area of the right distal ureter unchanged from prior studies. KUB from 09/16/2021 showed a persistent calcification in the area of the right distal ureter. He underwent right ureteroscopic laser lithotripsy with stone manipulation and stent insertion on 09/16/2021.  The stone was fragmented and removed.  He has a right ureteral stent in place with a tether. His stent was removed on 09/23/21. Stone analysis:  Calcium oxalate Urine culture:  >100K  E. Coli.  Treated with Bactrim DS x 7 days.  He returns today for follow-up.  He is doing very well.  No new urinary symptoms.  No dysuria or gross hematuria.  He continues on tamsulosin. IPSS = 3 today.   Portions of the above documentation were copied from a prior visit for review purposes only.  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   Presented with acute myocardial infarction In 01/2003; isolated 90% proximal LAD lesion treated with DES; EF 45-50%   Degenerative joint disease     Chronic neck and back pain   History of kidney stones    Hyperlipidemia    lipid profile in 06/2010:195, 207, 37, 117   Hypertension    IBS (irritable bowel syndrome)    Myocardial infarction (HCC)    PUD (peptic ulcer disease)    hx   Tobacco abuse, in remission     PSH: Past Surgical History:  Procedure Laterality Date   CARDIAC CATHETERIZATION     stent-2005   CERVICAL FUSION  04/2021   CHOLECYSTECTOMY N/A 05/18/2021   Procedure: LAPAROSCOPIC CHOLECYSTECTOMY;  Surgeon: [name], MD;  Location: [facility];  Service: General;  Laterality: N/A;   COLONOSCOPY  01/11/2006   CYSTOSCOPY W/ URETERAL STENT PLACEMENT Right 02/06/2021   Procedure: CYSTOSCOPY WITH RIGHT RETROGRADE PYELOGRAM/ RIGHT URETERAL STENT PLACEMENT/ RIGHT URETEROSCOPY;  Surgeon: Milderd Meager., MD;  Location: [facility];  Service: Urology;  Laterality: Right;   CYSTOSCOPY WITH RETROGRADE PYELOGRAM, URETEROSCOPY AND STENT PLACEMENT Right 09/16/2021   Procedure: CYSTOSCOPY WITH RETROGRADE PYELOGRAM, URETEROSCOPY AND STENT PLACEMENT;  Surgeon: Milderd Meager., MD;  Location: [facility];  Service: Urology;  Laterality: Right;   EXTRACORPOREAL SHOCK WAVE LITHOTRIPSY Right 02/10/2021   Procedure: EXTRACORPOREAL SHOCK WAVE LITHOTRIPSY (ESWL);  Surgeon: Milderd Meager., MD;  Location: [facility];  Service: Urology;  Laterality: Right;   EXTRACORPOREAL SHOCK WAVE LITHOTRIPSY Right 07/28/2021   Procedure: EXTRACORPOREAL SHOCK WAVE LITHOTRIPSY (ESWL);  Surgeon: Milderd Meager., MD;  Location: [facility];  Service: Urology;  Laterality: Right;   HOLMIUM LASER APPLICATION Right 09/16/2021   Procedure: HOLMIUM LASER APPLICATION;  Surgeon: Milderd Meager., MD;  Location: [facility];  Service: Urology;  Laterality: Right;   KIDNEY STONE SURGERY  01/12/1988   STONE EXTRACTION WITH BASKET Right 09/16/2021   Procedure: STONE EXTRACTION WITH BASKET;  Surgeon: Milderd Meager., MD;  Location: [facility];  Service: Urology;   Laterality: Right;    SH: Social History   Tobacco Use   Smoking status: Former    Packs/day: 1.50    Years: 20.00    Total pack years: 30.00    Types: Cigarettes    Quit date: 01/18/2003    Years since quitting: 18.8   Smokeless tobacco: Never  Vaping Use   Vaping Use: Never used  Substance Use Topics   Alcohol use: No   Drug use: No    ROS: Constitutional:  Negative for fever, chills, weight loss CV: Negative for chest pain, previous MI, hypertension Respiratory:  Negative for shortness of breath, wheezing, sleep apnea, frequent cough GI:  Negative for nausea, vomiting, bloody stool, GERD  PE: BP 135/75   Pulse 81  GENERAL APPEARANCE:  Well appearing, well developed, well nourished, NAD HEENT:  Atraumatic, normocephalic, oropharynx clear NECK:  Supple without lymphadenopathy or thyromegaly ABDOMEN:  Soft, non-tender, no masses EXTREMITIES:  Moves all extremities well, without clubbing, cyanosis, or edema NEUROLOGIC:  Alert and oriented x 3, normal gait, CN II-XII grossly intact MENTAL STATUS:  appropriate BACK:  Non-tender to palpation, No CVAT SKIN:  Warm, dry, and intact   Results: U/A: 0-5 WBCs, 0-2 RBCs, no bacteria

## 2021-11-18 LAB — URINALYSIS, ROUTINE W REFLEX MICROSCOPIC
Bilirubin, UA: NEGATIVE
Glucose, UA: NEGATIVE
Leukocytes,UA: NEGATIVE
Nitrite, UA: NEGATIVE
Specific Gravity, UA: 1.03 (ref 1.005–1.030)
Urobilinogen, Ur: 0.2 mg/dL (ref 0.2–1.0)
pH, UA: 5 (ref 5.0–7.5)

## 2021-11-18 LAB — MICROSCOPIC EXAMINATION: Bacteria, UA: NONE SEEN

## 2021-12-17 ENCOUNTER — Ambulatory Visit (HOSPITAL_COMMUNITY): Payer: Medicare PPO

## 2021-12-28 ENCOUNTER — Ambulatory Visit (HOSPITAL_COMMUNITY)
Admission: RE | Admit: 2021-12-28 | Discharge: 2021-12-28 | Disposition: A | Discharge status: Home / Self Care | Payer: Medicare PPO | Source: Ambulatory Visit | Attending: Urology | Admitting: Urology

## 2021-12-28 DIAGNOSIS — N201 Calculus of ureter: Secondary | ICD-10-CM | POA: Insufficient documentation

## 2021-12-29 ENCOUNTER — Telehealth: Payer: Self-pay

## 2021-12-29 ENCOUNTER — Other Ambulatory Visit: Payer: Medicare PPO

## 2021-12-29 DIAGNOSIS — R399 Unspecified symptoms and signs involving the genitourinary system: Secondary | ICD-10-CM | POA: Diagnosis not present

## 2021-12-29 NOTE — Telephone Encounter (Signed)
Patient called and states he had stopped taking tamsulosin for about 2 weeks and he noticed more pressure urinating and back pain.  He is asking if stopping the tamsulosin could cause this.  He did start back on the tamsulosin today.

## 2021-12-29 NOTE — Telephone Encounter (Addendum)
Patient called in regards to his [place], he states he is also having lower back pain and wants to know if he can have his urine checked for a UTI.  I informed him Dr. Pete Glatter is now at the [facility] clinic and we are still waiting for him to review his [place].  Patient added to the lab schedule and will have MD in office review ua and treat if necessary.  Patient voiced understanding and requested to stay at the [facility] office.  Dr. Ronne Binning reviewed ua in office and no culture needed, pt aware we will wait for Dr. Pete Glatter to review [place].

## 2021-12-30 LAB — URINALYSIS, ROUTINE W REFLEX MICROSCOPIC
Bilirubin, UA: NEGATIVE
Glucose, UA: NEGATIVE
Ketones, UA: NEGATIVE
Leukocytes,UA: NEGATIVE
Nitrite, UA: NEGATIVE
RBC, UA: NEGATIVE
Specific Gravity, UA: 1.02 (ref 1.005–1.030)
Urobilinogen, Ur: 0.2 mg/dL (ref 0.2–1.0)
pH, UA: 5.5 (ref 5.0–7.5)

## 2021-12-31 DIAGNOSIS — I10 Essential (primary) hypertension: Secondary | ICD-10-CM | POA: Diagnosis not present

## 2021-12-31 DIAGNOSIS — I2510 Atherosclerotic heart disease of native coronary artery without angina pectoris: Secondary | ICD-10-CM | POA: Diagnosis not present

## 2021-12-31 DIAGNOSIS — Z125 Encounter for screening for malignant neoplasm of prostate: Secondary | ICD-10-CM | POA: Diagnosis not present

## 2021-12-31 DIAGNOSIS — E785 Hyperlipidemia, unspecified: Secondary | ICD-10-CM | POA: Diagnosis not present

## 2021-12-31 DIAGNOSIS — Z79899 Other long term (current) drug therapy: Secondary | ICD-10-CM | POA: Diagnosis not present

## 2021-12-31 DIAGNOSIS — N400 Enlarged prostate without lower urinary tract symptoms: Secondary | ICD-10-CM | POA: Diagnosis not present

## 2022-01-01 DIAGNOSIS — I10 Essential (primary) hypertension: Secondary | ICD-10-CM | POA: Diagnosis not present

## 2022-01-01 DIAGNOSIS — E785 Hyperlipidemia, unspecified: Secondary | ICD-10-CM | POA: Diagnosis not present

## 2022-01-01 DIAGNOSIS — I2510 Atherosclerotic heart disease of native coronary artery without angina pectoris: Secondary | ICD-10-CM | POA: Diagnosis not present

## 2022-01-01 DIAGNOSIS — M5416 Radiculopathy, lumbar region: Secondary | ICD-10-CM | POA: Diagnosis not present

## 2022-01-07 ENCOUNTER — Telehealth: Payer: Self-pay

## 2022-01-07 NOTE — Telephone Encounter (Signed)
-----   Message from Milderd Meager, MD sent at 01/05/2022  8:38 AM EST ----- Please notify patient of normal renal U/S. Follow-up as needed.

## 2022-01-07 NOTE — Telephone Encounter (Signed)
Patient aware of MD response and will f/u as needed.

## 2022-04-14 ENCOUNTER — Other Ambulatory Visit: Payer: Self-pay | Admitting: Urology

## 2022-04-14 DIAGNOSIS — N401 Enlarged prostate with lower urinary tract symptoms: Secondary | ICD-10-CM

## 2022-07-05 DIAGNOSIS — I10 Essential (primary) hypertension: Secondary | ICD-10-CM | POA: Diagnosis not present

## 2022-07-05 DIAGNOSIS — I2510 Atherosclerotic heart disease of native coronary artery without angina pectoris: Secondary | ICD-10-CM | POA: Diagnosis not present

## 2022-07-22 DIAGNOSIS — R195 Other fecal abnormalities: Secondary | ICD-10-CM | POA: Diagnosis not present

## 2022-07-26 DIAGNOSIS — D485 Neoplasm of uncertain behavior of skin: Secondary | ICD-10-CM | POA: Diagnosis not present

## 2022-07-26 DIAGNOSIS — D2239 Melanocytic nevi of other parts of face: Secondary | ICD-10-CM | POA: Diagnosis not present

## 2022-07-26 DIAGNOSIS — K921 Melena: Secondary | ICD-10-CM | POA: Diagnosis not present

## 2022-07-26 DIAGNOSIS — L570 Actinic keratosis: Secondary | ICD-10-CM | POA: Diagnosis not present

## 2022-07-26 DIAGNOSIS — X32XXXA Exposure to sunlight, initial encounter: Secondary | ICD-10-CM | POA: Diagnosis not present

## 2022-07-26 DIAGNOSIS — D649 Anemia, unspecified: Secondary | ICD-10-CM | POA: Diagnosis not present

## 2022-08-16 DIAGNOSIS — D649 Anemia, unspecified: Secondary | ICD-10-CM | POA: Diagnosis not present

## 2022-08-17 DIAGNOSIS — R202 Paresthesia of skin: Secondary | ICD-10-CM | POA: Diagnosis not present

## 2022-08-17 DIAGNOSIS — D649 Anemia, unspecified: Secondary | ICD-10-CM | POA: Diagnosis not present

## 2022-08-17 DIAGNOSIS — K921 Melena: Secondary | ICD-10-CM | POA: Diagnosis not present

## 2022-09-10 DIAGNOSIS — D649 Anemia, unspecified: Secondary | ICD-10-CM | POA: Diagnosis not present

## 2022-09-17 DIAGNOSIS — D509 Iron deficiency anemia, unspecified: Secondary | ICD-10-CM | POA: Diagnosis not present

## 2022-09-17 DIAGNOSIS — I2542 Coronary artery dissection: Secondary | ICD-10-CM | POA: Diagnosis not present

## 2022-12-10 IMAGING — DX DG CERVICAL SPINE COMPLETE 4+V
6 series · 6 of 6 positions shown · non-contrast
Comparison: No recent prior.

CLINICAL DATA: Neck pain.  No known injury.

EXAM:
CERVICAL SPINE - COMPLETE 4+ VIEW

[c-spine lat]
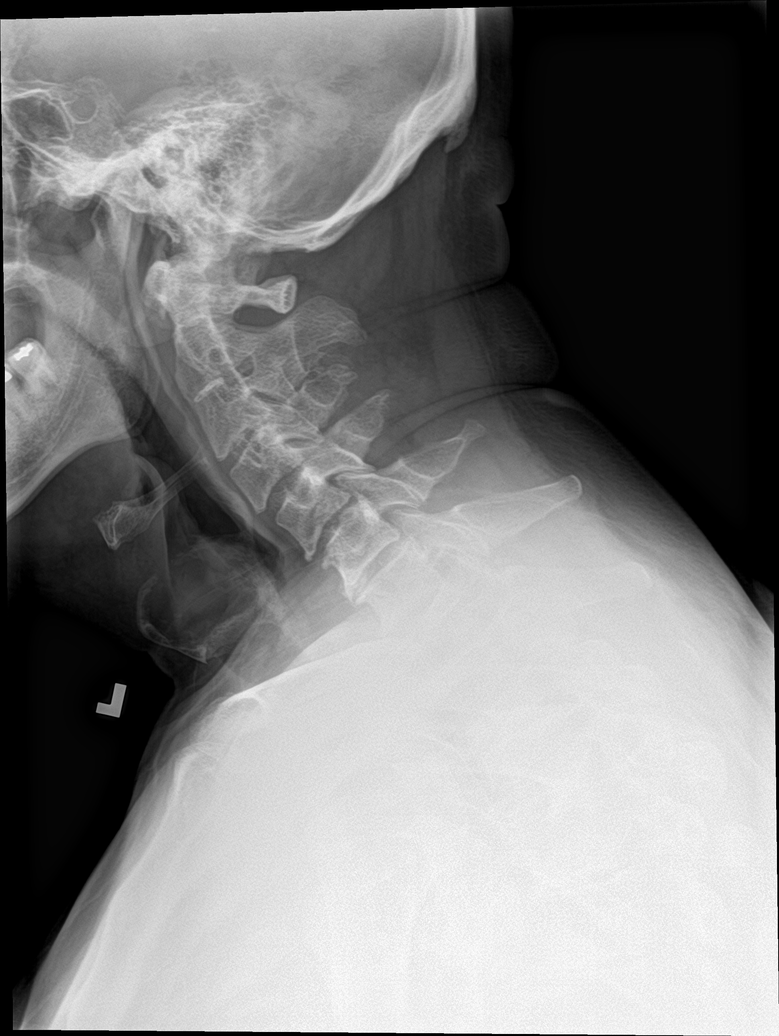

[c-spine open mouth]
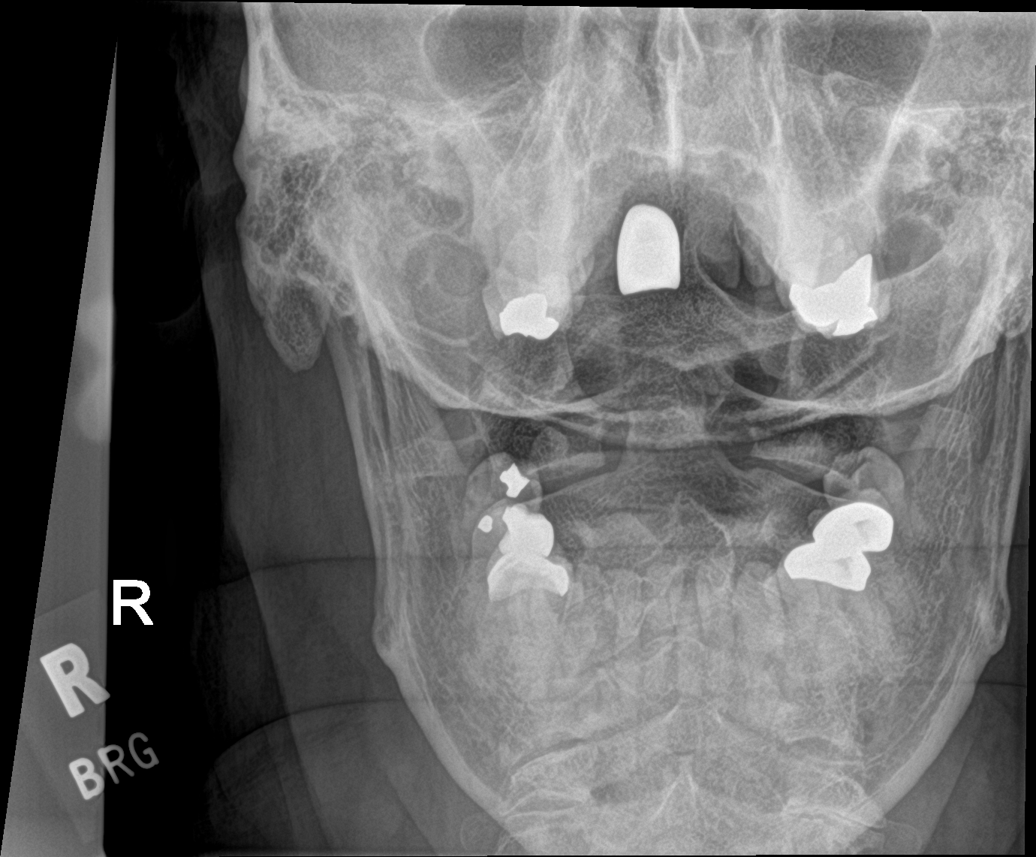

[c-spine obl (1 of 2)]
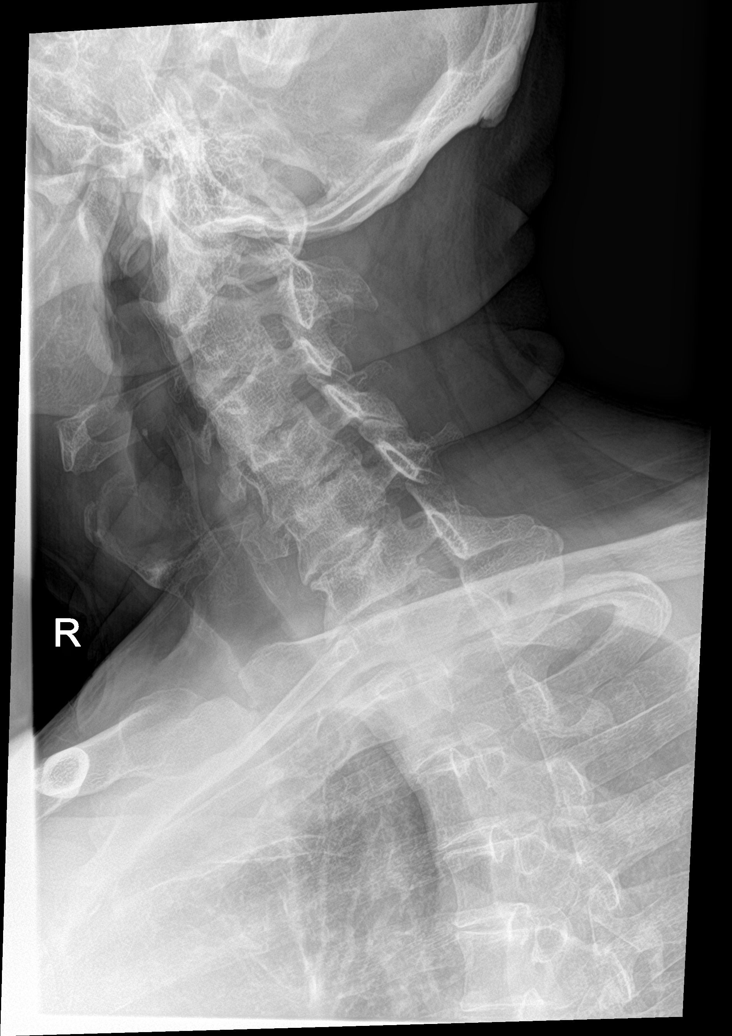

[c-spine ap]
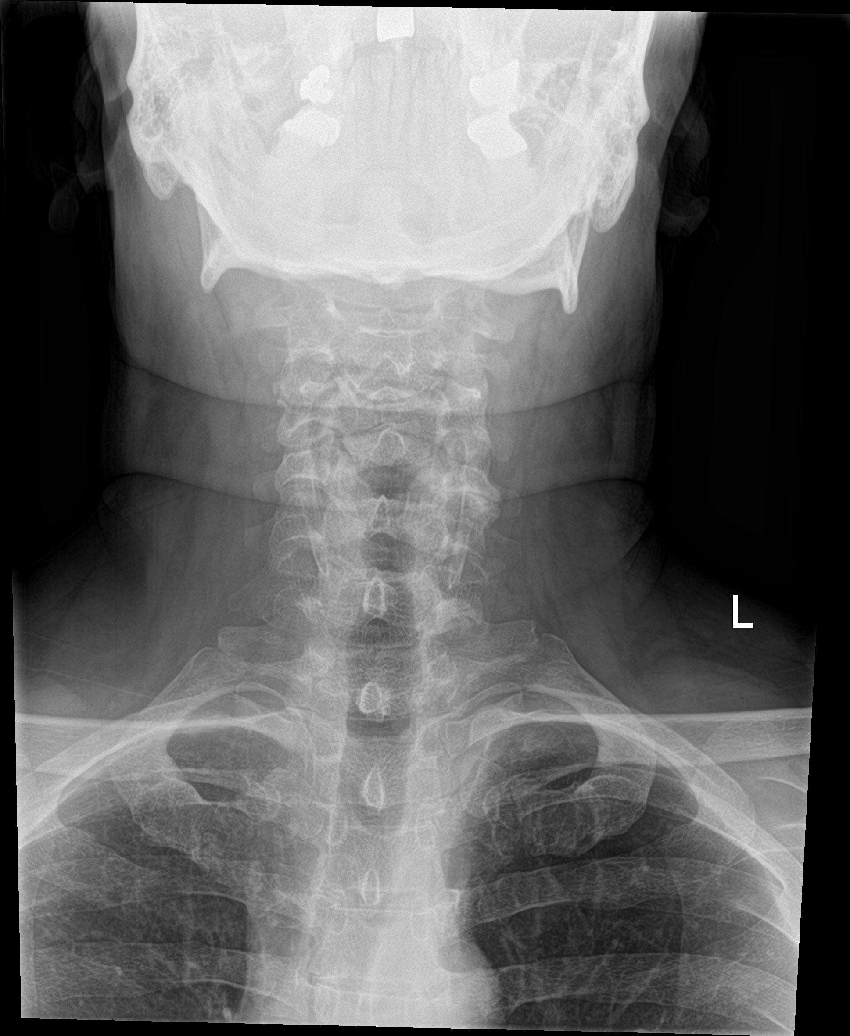

[c-spine obl (2 of 2)]
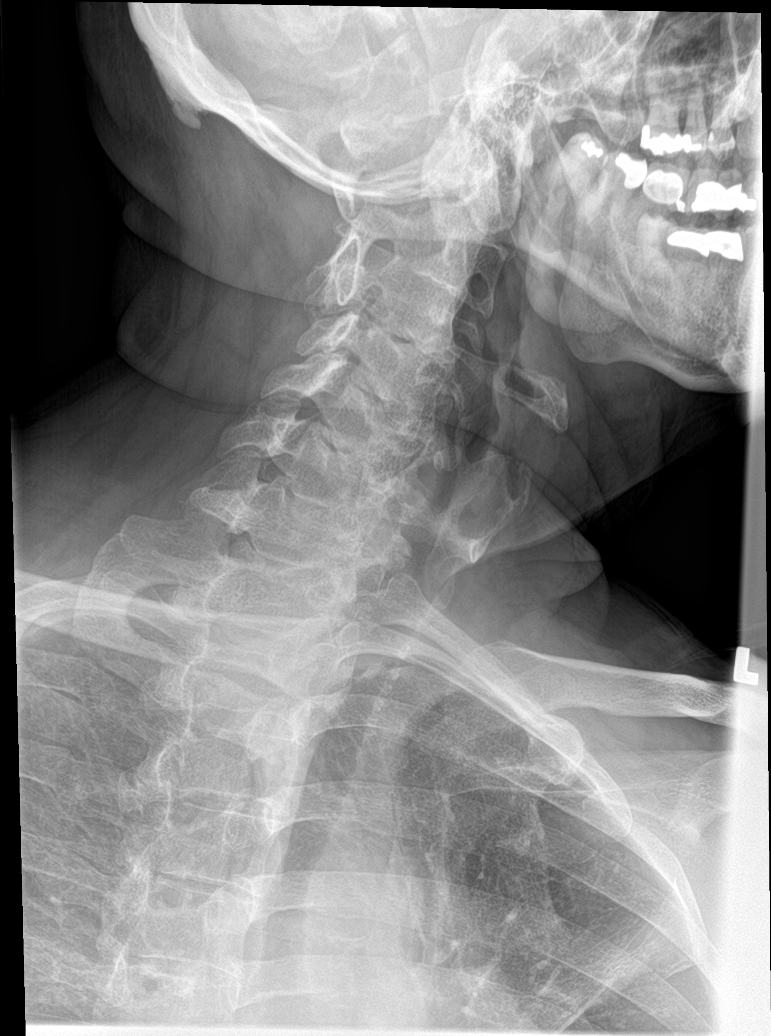

[c-spine swimmers trauma]
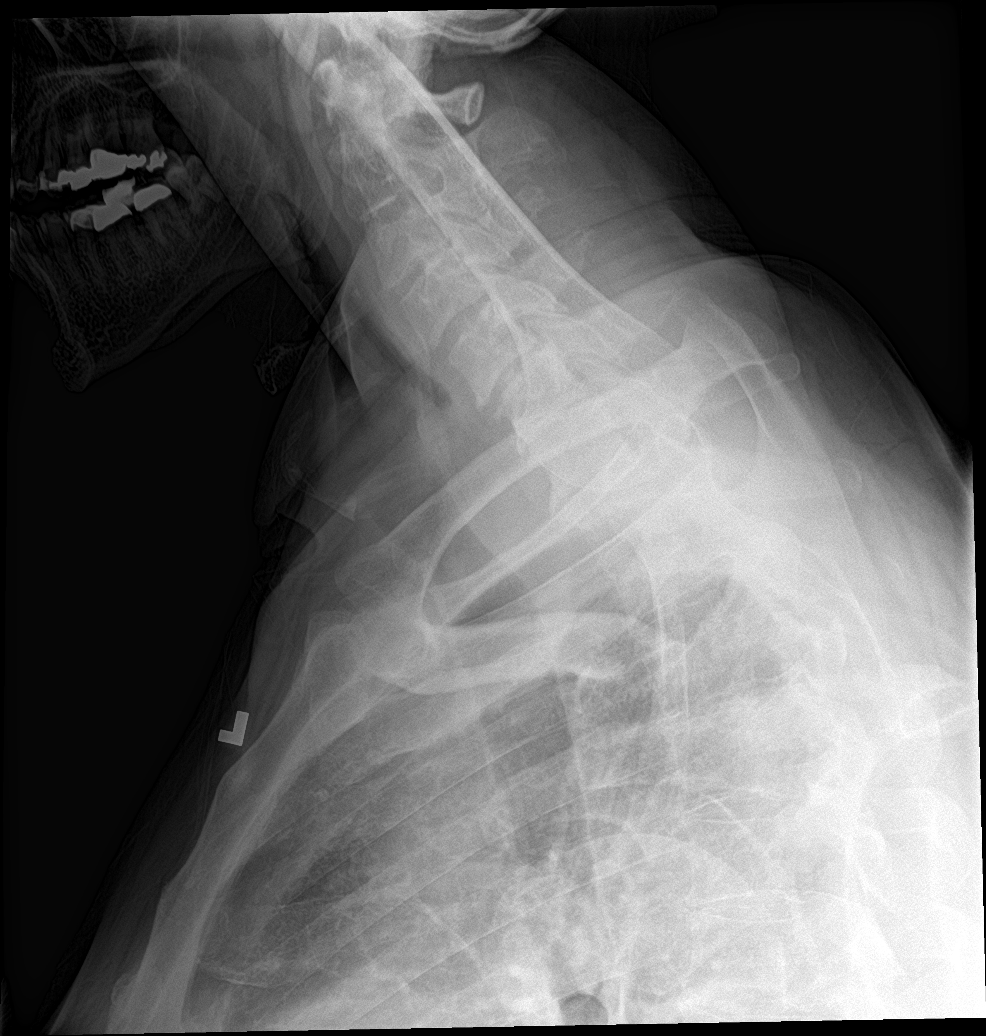

[6 of 6 positions shown; findings below may reference images not displayed]

FINDINGS: Congenital partial fusion C2-C3. Diffuse multilevel degenerative
change. No acute bony abnormality. No evidence of fracture
dislocation. Pulmonary apices are clear.
IMPRESSION: Congenital partial fusion C2-C3. Diffuse multilevel degenerative
change. No acute abnormality identified.

## 2022-12-22 DIAGNOSIS — Z125 Encounter for screening for malignant neoplasm of prostate: Secondary | ICD-10-CM | POA: Diagnosis not present

## 2022-12-22 DIAGNOSIS — I2510 Atherosclerotic heart disease of native coronary artery without angina pectoris: Secondary | ICD-10-CM | POA: Diagnosis not present

## 2022-12-22 DIAGNOSIS — I10 Essential (primary) hypertension: Secondary | ICD-10-CM | POA: Diagnosis not present

## 2022-12-22 DIAGNOSIS — Z79899 Other long term (current) drug therapy: Secondary | ICD-10-CM | POA: Diagnosis not present

## 2022-12-29 DIAGNOSIS — I2542 Coronary artery dissection: Secondary | ICD-10-CM | POA: Diagnosis not present

## 2022-12-29 DIAGNOSIS — E785 Hyperlipidemia, unspecified: Secondary | ICD-10-CM | POA: Diagnosis not present

## 2022-12-29 DIAGNOSIS — D509 Iron deficiency anemia, unspecified: Secondary | ICD-10-CM | POA: Diagnosis not present

## 2023-04-14 DIAGNOSIS — H04123 Dry eye syndrome of bilateral lacrimal glands: Secondary | ICD-10-CM | POA: Diagnosis not present

## 2023-04-14 DIAGNOSIS — H43813 Vitreous degeneration, bilateral: Secondary | ICD-10-CM | POA: Diagnosis not present

## 2023-04-14 DIAGNOSIS — H532 Diplopia: Secondary | ICD-10-CM | POA: Diagnosis not present

## 2023-04-14 DIAGNOSIS — H4922 Sixth [abducent] nerve palsy, left eye: Secondary | ICD-10-CM | POA: Diagnosis not present

## 2023-04-14 DIAGNOSIS — H02834 Dermatochalasis of left upper eyelid: Secondary | ICD-10-CM | POA: Diagnosis not present

## 2023-04-14 DIAGNOSIS — H02831 Dermatochalasis of right upper eyelid: Secondary | ICD-10-CM | POA: Diagnosis not present

## 2023-04-15 DIAGNOSIS — H532 Diplopia: Secondary | ICD-10-CM | POA: Diagnosis not present

## 2023-04-19 ENCOUNTER — Other Ambulatory Visit (HOSPITAL_COMMUNITY): Payer: Self-pay | Admitting: Physician Assistant

## 2023-04-19 DIAGNOSIS — H532 Diplopia: Secondary | ICD-10-CM

## 2023-04-22 ENCOUNTER — Other Ambulatory Visit: Payer: Self-pay | Admitting: Urology

## 2023-04-22 ENCOUNTER — Ambulatory Visit (HOSPITAL_COMMUNITY)
Admission: RE | Admit: 2023-04-22 | Discharge: 2023-04-22 | Disposition: A | Discharge status: Home / Self Care | Source: Ambulatory Visit | Attending: Physician Assistant | Admitting: Physician Assistant

## 2023-04-22 DIAGNOSIS — G319 Degenerative disease of nervous system, unspecified: Secondary | ICD-10-CM | POA: Diagnosis not present

## 2023-04-22 DIAGNOSIS — N401 Enlarged prostate with lower urinary tract symptoms: Secondary | ICD-10-CM

## 2023-04-22 DIAGNOSIS — H532 Diplopia: Secondary | ICD-10-CM | POA: Diagnosis not present

## 2023-04-22 MED ORDER — GADOBUTROL 1 MMOL/ML IV SOLN
10.0000 mL | Freq: Once | INTRAVENOUS | Status: AC | PRN
  Administered 2023-04-22: 10 mL via INTRAVENOUS

## 2023-05-04 DIAGNOSIS — H43813 Vitreous degeneration, bilateral: Secondary | ICD-10-CM | POA: Diagnosis not present

## 2023-05-04 DIAGNOSIS — Z961 Presence of intraocular lens: Secondary | ICD-10-CM | POA: Diagnosis not present

## 2023-05-04 DIAGNOSIS — H02831 Dermatochalasis of right upper eyelid: Secondary | ICD-10-CM | POA: Diagnosis not present

## 2023-05-04 DIAGNOSIS — H02834 Dermatochalasis of left upper eyelid: Secondary | ICD-10-CM | POA: Diagnosis not present

## 2023-05-04 DIAGNOSIS — H4922 Sixth [abducent] nerve palsy, left eye: Secondary | ICD-10-CM | POA: Diagnosis not present

## 2023-05-04 DIAGNOSIS — H04123 Dry eye syndrome of bilateral lacrimal glands: Secondary | ICD-10-CM | POA: Diagnosis not present

## 2023-05-04 DIAGNOSIS — H532 Diplopia: Secondary | ICD-10-CM | POA: Diagnosis not present

## 2023-07-08 DIAGNOSIS — K219 Gastro-esophageal reflux disease without esophagitis: Secondary | ICD-10-CM | POA: Diagnosis not present

## 2023-07-08 DIAGNOSIS — I10 Essential (primary) hypertension: Secondary | ICD-10-CM | POA: Diagnosis not present

## 2023-07-08 DIAGNOSIS — I2542 Coronary artery dissection: Secondary | ICD-10-CM | POA: Diagnosis not present

## 2023-12-22 IMAGING — CT CT ABD-PEL WO/W CM
4 of 12 series · 12 of 46 positions shown, 17 images · IV contrast (APPLIED)
Comparison: None.

CLINICAL DATA: Hematuria and flank pain.  History of renal stones.

EXAM:
CT ABDOMEN AND PELVIS WITHOUT AND WITH CONTRAST
TECHNIQUE: Multidetector CT imaging of the abdomen and pelvis was performed
following the standard protocol before and following the bolus
administration of intravenous contrast.

[Series 3: ap pre supine · axial · non-contrast · 0.86mm/px · z∈[-792,-522]mm · 3 of 110 slices shown, 7 images]
[im 28/110  soft-tissue]
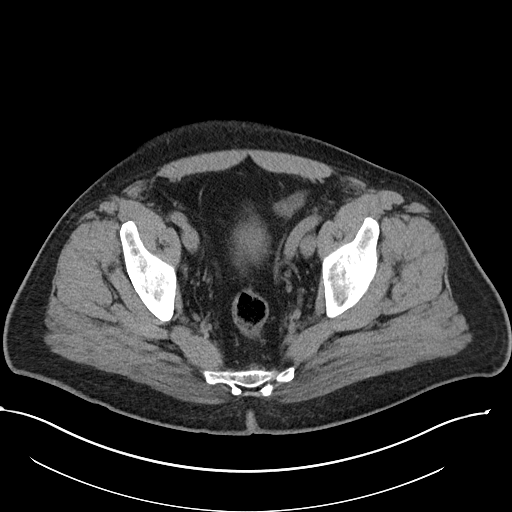
[im 28/110  lung]
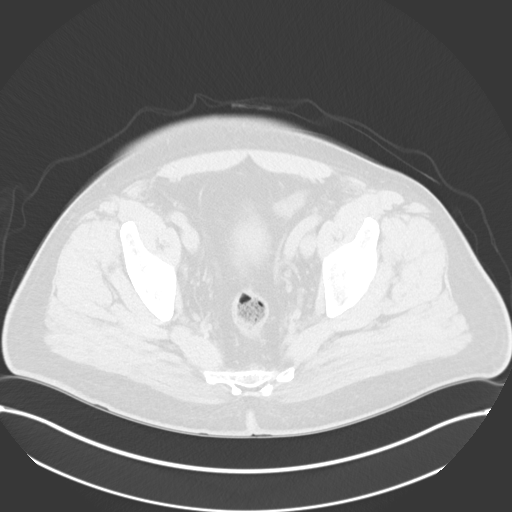
[im 28/110  bone]
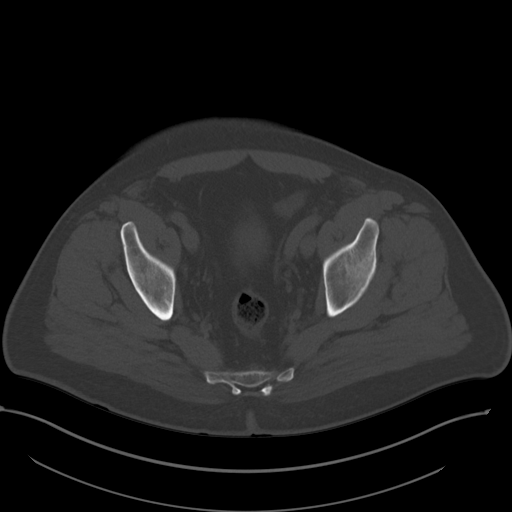
[im 55/110  soft-tissue]
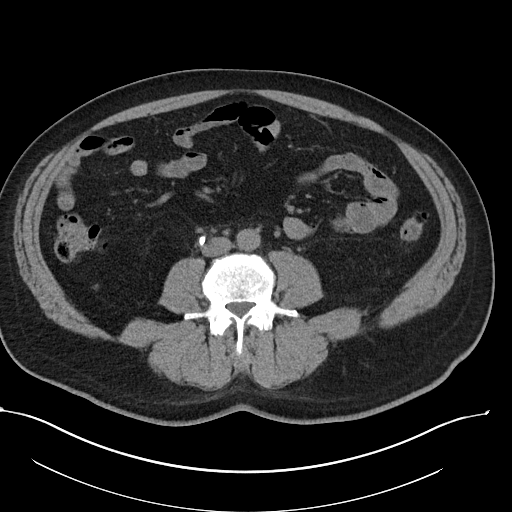
[im 55/110  lung]
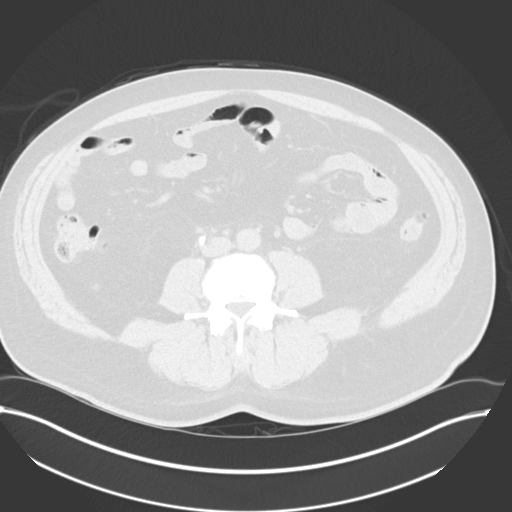
[im 82/110  soft-tissue]
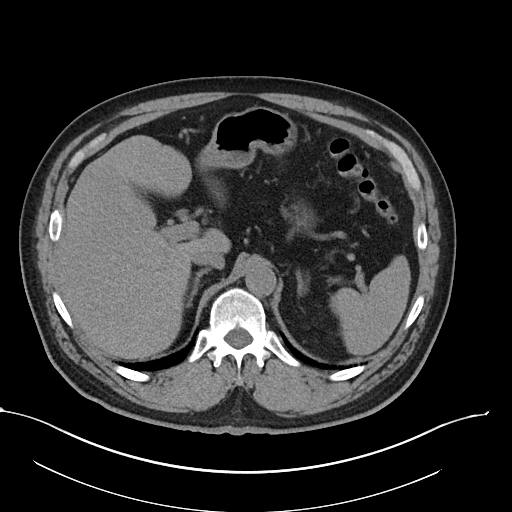
[im 82/110  lung]
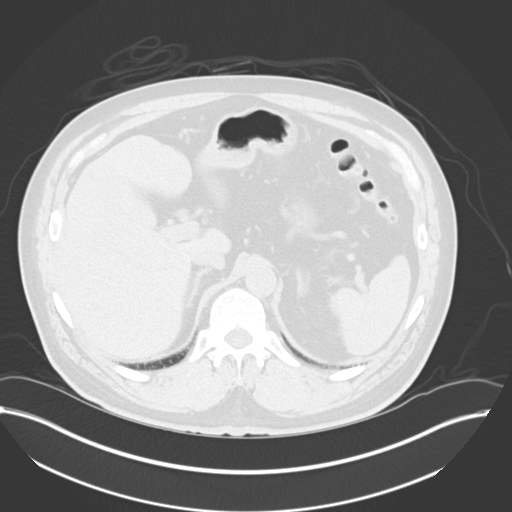

[Series 6: cor pre · coronal · non-contrast · 0.87mm/px · 2 of 107 slices shown, 3 images]
[im 36/107  soft-tissue]
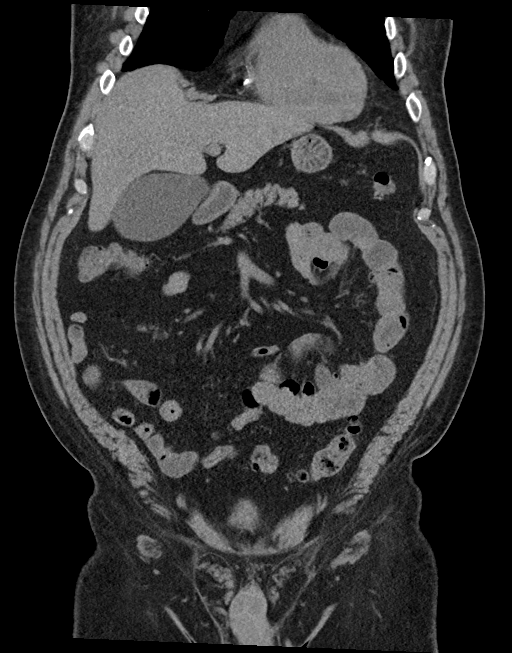
[im 36/107  bone]
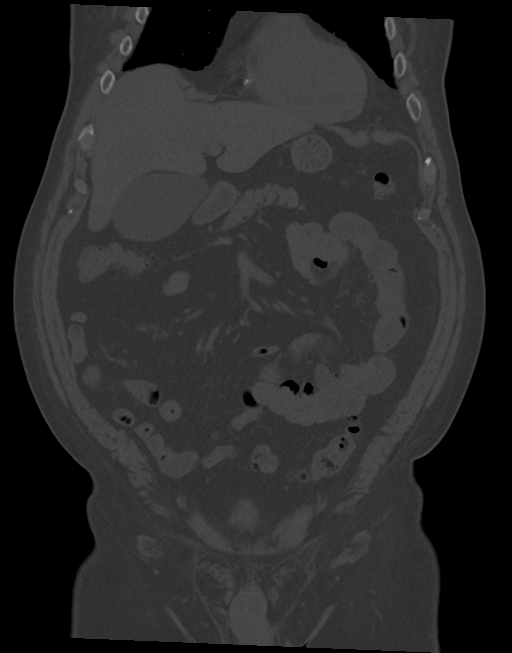
[im 71/107  soft-tissue]
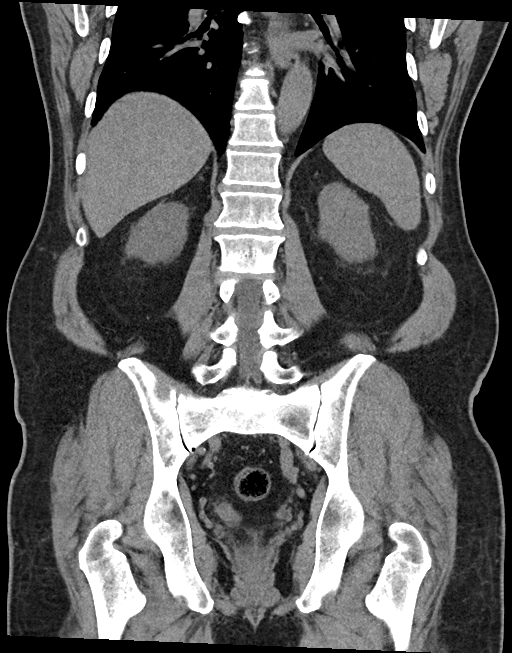

[Series 8: hematuria <45 with · axial · 0.88mm/px · z∈[-810,-496]mm · 4 of 106 slices shown]
[im 22/106  soft-tissue]
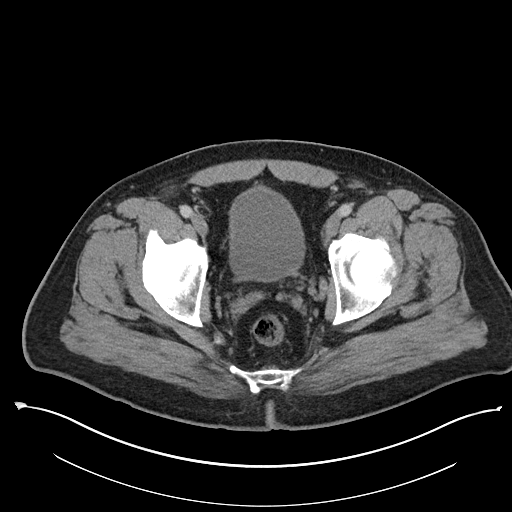
[im 43/106  soft-tissue]
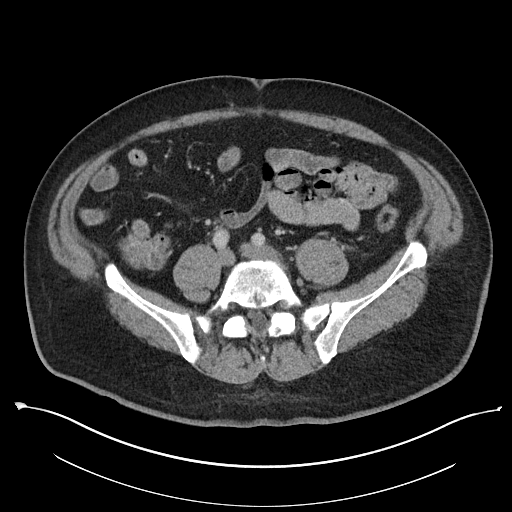
[im 64/106  soft-tissue]
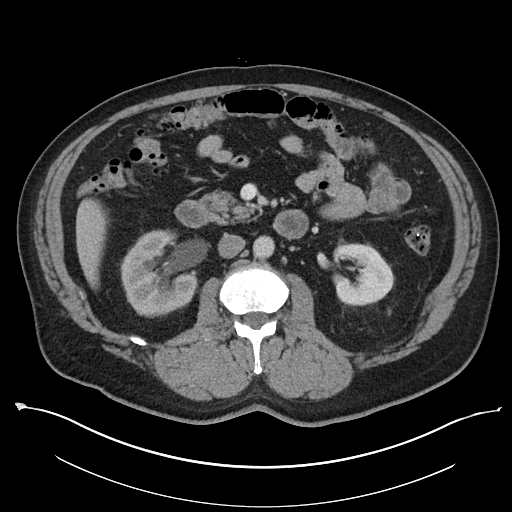
[im 85/106  soft-tissue]
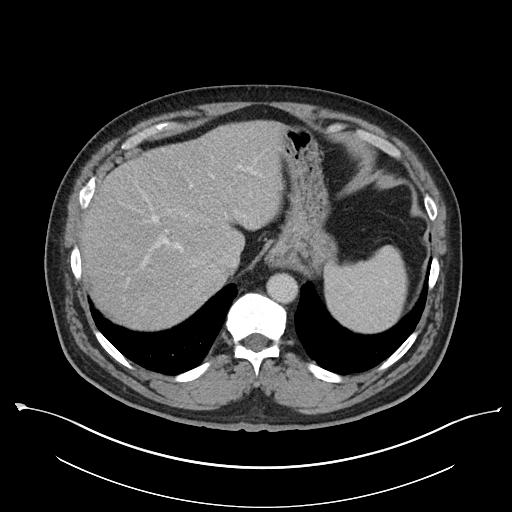

[Series 15: ap without · axial · non-contrast · 0.91mm/px · z∈[-764,-549]mm · 3 of 109 slices shown]
[im 22/109  soft-tissue]
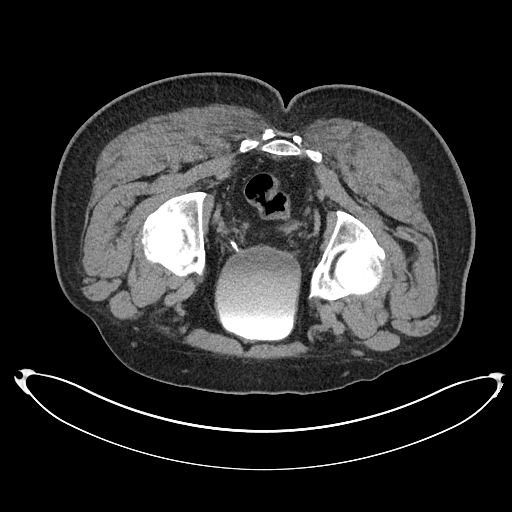
[im 44/109  soft-tissue]
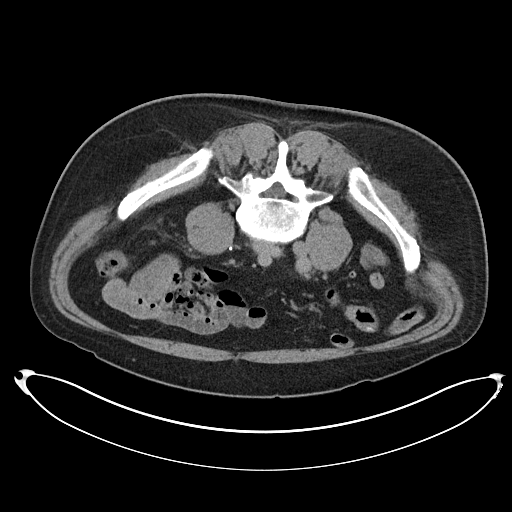
[im 65/109  soft-tissue]
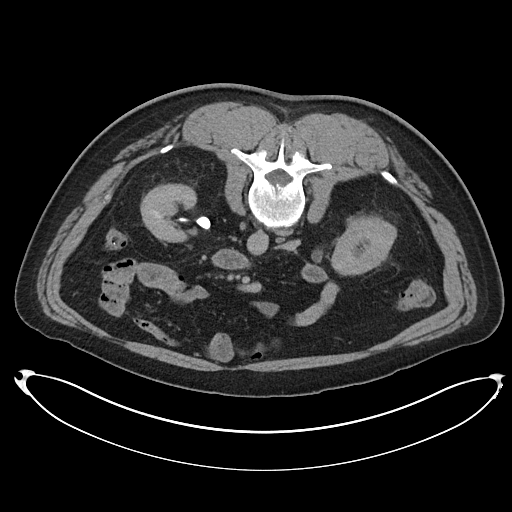

[12 of 46 positions shown; findings below may reference images not displayed]

RADIATION DOSE REDUCTION: This exam was performed according to the
departmental dose-optimization program which includes automated
exposure control, adjustment of the mA and/or kV according to
patient size and/or use of iterative reconstruction technique.

CONTRAST:  80mL OMNIPAQUE IOHEXOL 300 MG/ML  SOLN
FINDINGS: Lower chest: Mild diffuse bronchial wall thickening in the lung
bases. Patchy atelectasis for scarring. No acute abnormality.

Hepatobiliary: No suspicious hepatic lesion. Cholelithiasis without
findings of acute cholecystitis. No biliary ductal dilation.

Pancreas: No pancreatic ductal dilation or evidence of acute
inflammation.

Spleen: Normal in size without focal abnormality.

Adrenals/Urinary Tract: Bilateral adrenal glands are unremarkable.

Edematous appearance of the right kidney with hydroureteronephrosis
to the level of a 8 x 4 mm stone in the proximal ureter on image
57/8 and 51/6, additionally the kidney demonstrates relatively
delayed enhancement and excretion of contrast material.

No solid enhancing renal mass. Left kidney is unremarkable without
hydronephrosis or nephrolithiasis.

Evaluation of the urinary bladder is limited by urine contamination
within this context there is no abnormal wall thickening or
suspicious intraluminal filling defect identified.

Stomach/Bowel: No radiopaque enteric contrast was administered.
Stomach is predominantly decompressed limiting evaluation. Small
hiatal hernia. No pathologic dilation of large or small bowel. The
appendix and terminal ileum appear normal. Extensive colonic
diverticulosis without findings of acute diverticulitis.

Vascular/Lymphatic: Aortic and branch vessel atherosclerosis without
abdominal aortic aneurysm. No pathologically enlarged abdominal or
pelvic lymph nodes.

Reproductive: Mild enlargement of the prostate gland.

Other: No significant abdominopelvic free fluid. No
pneumoperitoneum.

Musculoskeletal: L5-S1 discogenic disease. No acute osseous
abnormality.
IMPRESSION: 1. Obstructing 8 x 4 mm stone in the proximal right ureter with
associated hydroureteronephrosis, consider correlation with
laboratory values to exclude superimposed infection.
2. Cholelithiasis without findings of acute cholecystitis.
3. Extensive colonic diverticulosis without findings of acute
diverticulitis.
4. Small hiatal hernia.
5.  Aortic Atherosclerosis ([id number]).

These results will be called to the ordering clinician or
representative by the Radiologist Assistant, and communication
documented in the PACS or [REDACTED].

## 2024-01-04 ENCOUNTER — Encounter (INDEPENDENT_AMBULATORY_CARE_PROVIDER_SITE_OTHER): Payer: Self-pay | Admitting: *Deleted
